# Patient Record
Sex: Male | Born: 1990 | Race: Black or African American | Hispanic: No | Marital: Married | Smoking: Former smoker
Health system: Southern US, Community
[De-identification: ages and names within clinical notes are randomized; demographics above are authoritative.]

## PROBLEM LIST (undated history)

## (undated) HISTORY — PX: EYE SURGERY: SHX253

---
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
## 2016-02-01 ENCOUNTER — Emergency Department (HOSPITAL_COMMUNITY)
Admission: EM | Admit: 2016-02-01 | Discharge: 2016-02-01 | Disposition: A | Discharge status: Home / Self Care | Payer: Self-pay | Attending: Emergency Medicine | Admitting: Emergency Medicine

## 2016-02-01 ENCOUNTER — Encounter (HOSPITAL_COMMUNITY): Payer: Self-pay | Admitting: *Deleted

## 2016-02-01 DIAGNOSIS — F17210 Nicotine dependence, cigarettes, uncomplicated: Secondary | ICD-10-CM | POA: Insufficient documentation

## 2016-02-01 DIAGNOSIS — R197 Diarrhea, unspecified: Secondary | ICD-10-CM | POA: Insufficient documentation

## 2016-02-01 MED ORDER — ONDANSETRON 8 MG PO TBDP
8.0000 mg | ORAL_TABLET | Freq: Once | ORAL | Status: AC
  Administered 2016-02-01: 8 mg via ORAL
  Filled 2016-02-01: qty 1

## 2016-02-01 NOTE — ED Notes (Signed)
Pt verbalized understanding of discharge instructions. Pt ambulatory to waiting room.  

## 2016-02-01 NOTE — ED Provider Notes (Signed)
  [facility]PT Provider Note   CSN: [id number] Arrival date & time: 02/01/16  0102     History   Chief Complaint Chief Complaint  Patient presents with  . Abdominal Pain    HPI Philip Bowman is a 26 y.o. male.  The history is provided by the patient.  Abdominal Pain   This is a new problem. The current episode started 3 to 5 hours ago. The problem occurs constantly. The problem has been gradually improving. The pain is associated with eating. The pain is located in the generalized abdominal region. The pain is mild. Associated symptoms include diarrhea and nausea. Pertinent negatives include fever, vomiting and dysuria. Nothing aggravates the symptoms. Nothing relieves the symptoms.  pt reports he ate at a local [employer] and soon after developed nausea/abd pain and diarrhea No other complaints He is requesting a work note   PMH - none Soc hx - no travel Past Surgical History:  Procedure Laterality Date  . EYE SURGERY Left        Home Medications    Prior to Admission medications   Not on File    Family History History reviewed. No pertinent family history.  Social History Social History  Substance Use Topics  . Smoking status: Current Some Day Smoker    Types: Cigarettes  . Smokeless tobacco: Never Used  . Alcohol use Yes     Comment: occasionally     Allergies   Patient has no allergy information on record.   Review of Systems Review of Systems  Constitutional: Negative for fever.  Cardiovascular: Negative for chest pain.  Gastrointestinal: Positive for abdominal pain, diarrhea and nausea. Negative for vomiting.  Genitourinary: Negative for dysuria.  All other systems reviewed and are negative.    Physical Exam Updated Vital Signs BP 123/71 (BP Location: Left Arm)   Pulse 65   Temp 97.4 F (36.3 C) (Oral)   Resp 20   Ht 5\' 7"  (1.702 m)   Wt 61.2 kg   SpO2 98%   BMI 21.14 kg/m   Physical Exam CONSTITUTIONAL: Well  developed/well nourished HEAD: Normocephalic/atraumatic EYES: EOMI/PERRL, no icterus ENMT: Mucous membranes moist NECK: supple no meningeal signs SPINE/BACK:entire spine nontender CV: S1/S2 noted, no murmurs/rubs/gallops noted LUNGS: Lungs are clear to auscultation bilaterally, no apparent distress ABDOMEN: soft, nontender NEURO: Pt is awake/alert/appropriate, moves all extremitiesx4.  No facial droop.   EXTREMITIES: pulses normal/equal, full ROM SKIN: warm, color normal PSYCH: no abnormalities of mood noted, alert and oriented to situation   ED Treatments / Results  Labs (all labs ordered are listed, but only abnormal results are displayed) Labs Reviewed - No data to display  EKG  EKG Interpretation None       Radiology No results found.  Procedures Procedures (including critical care time)  Medications Ordered in ED Medications  ondansetron (ZOFRAN-ODT) disintegrating tablet 8 mg (8 mg Oral Given 02/01/16 0141)     Initial Impression / Assessment and Plan / ED Course  I have reviewed the triage vital signs and the nursing notes.    Final Clinical Impressions(s) / ED Diagnoses   Final diagnoses:  Diarrhea of presumed infectious origin    New Prescriptions New Prescriptions   No medications on file     Zadie Rhineonald Giavanni Zeitlin, MD 02/01/16 [id number]

## 2016-02-01 NOTE — ED Triage Notes (Signed)
Pt c/o nausea and diarrhea after eating japanese food;

## 2016-02-21 DIAGNOSIS — R197 Diarrhea, unspecified: Secondary | ICD-10-CM | POA: Insufficient documentation

## 2016-02-21 DIAGNOSIS — R109 Unspecified abdominal pain: Secondary | ICD-10-CM | POA: Insufficient documentation

## 2016-02-21 DIAGNOSIS — F17210 Nicotine dependence, cigarettes, uncomplicated: Secondary | ICD-10-CM | POA: Insufficient documentation

## 2016-02-21 DIAGNOSIS — R1110 Vomiting, unspecified: Secondary | ICD-10-CM | POA: Insufficient documentation

## 2016-02-21 NOTE — ED Triage Notes (Signed)
Patient complaining of vomiting x 1 and diarrhea x 2 after [place] tonight. States "the chicken felt a little funny." States "if I don't go to work tonight I have to have a note so I came here."

## 2016-02-22 ENCOUNTER — Encounter (HOSPITAL_COMMUNITY): Payer: Self-pay | Admitting: Emergency Medicine

## 2016-02-22 ENCOUNTER — Emergency Department (HOSPITAL_COMMUNITY)
Admission: EM | Admit: 2016-02-22 | Discharge: 2016-02-22 | Disposition: A | Discharge status: Home / Self Care | Payer: Self-pay | Attending: Emergency Medicine | Admitting: Emergency Medicine

## 2016-02-22 DIAGNOSIS — R197 Diarrhea, unspecified: Secondary | ICD-10-CM

## 2016-02-22 DIAGNOSIS — R1110 Vomiting, unspecified: Secondary | ICD-10-CM

## 2016-02-22 NOTE — ED Provider Notes (Signed)
  [facility] Provider Note   CSN: [id number] Arrival date & time: 02/21/16  2340     History   Chief Complaint Chief Complaint  Patient presents with  . Emesis  . Diarrhea    HPI Livingston DionesStefan J Lesch is a 26 y.o. male.  The history is provided by the patient.  Emesis   This is a new problem. The current episode started 1 to 2 hours ago. The problem has been gradually improving. There has been no fever. Associated symptoms include abdominal pain and diarrhea. Pertinent negatives include no cough, no fever and no headaches. Risk factors include suspect food intake.  Diarrhea   Associated symptoms include abdominal pain and vomiting. Pertinent negatives include no headaches and no cough.  patient presents for vomiting/diarrhea and abdominal pain after [place] [employer] He had similar episode last month but it was a [employer] He is feeling improved He requests work note   PMH - none Past Surgical History:  Procedure Laterality Date  . EYE SURGERY Left        Home Medications    Prior to Admission medications   Not on File    Family History History reviewed. No pertinent family history.  Social History Social History  Substance Use Topics  . Smoking status: Current Some Day Smoker    Types: Cigarettes  . Smokeless tobacco: Never Used  . Alcohol use Yes     Comment: occasionally     Allergies   Patient has no known allergies.   Review of Systems Review of Systems  Constitutional: Negative for fever.  Respiratory: Negative for cough.   Gastrointestinal: Positive for abdominal pain, diarrhea and vomiting. Negative for blood in stool.  Neurological: Negative for headaches.  All other systems reviewed and are negative.    Physical Exam Updated Vital Signs BP 115/63 (BP Location: Left Arm)   Pulse 70   Temp 98.2 F (36.8 C) (Oral)   Resp 14   Ht 5\' 7"  (1.702 m)   Wt 61.2 kg   SpO2 100%   BMI 21.14 kg/m     Physical Exam CONSTITUTIONAL: Well developed/well nourished HEAD: Normocephalic/atraumatic EYES: EOMI/PERRL, no icterus ENMT: Mucous membranes moist NECK: supple no meningeal signs SPINE/BACK:entire spine nontender CV: S1/S2 noted, no murmurs/rubs/gallops noted LUNGS: Lungs are clear to auscultation bilaterally, no apparent distress ABDOMEN: soft, nontender NEURO: Pt is awake/alert/appropriate, moves all extremitiesx4.  No facial droop.   EXTREMITIES: pulses normal/equal, full ROM SKIN: warm, color normal PSYCH: no abnormalities of mood noted, alert and oriented to situation   ED Treatments / Results  Labs (all labs ordered are listed, but only abnormal results are displayed) Labs Reviewed - No data to display  EKG  EKG Interpretation None       Radiology No results found.  Procedures Procedures (including critical care time)  Medications Ordered in ED Medications - No data to display   Initial Impression / Assessment and Plan / ED Course  I have reviewed the triage vital signs and the nursing notes.        Final Clinical Impressions(s) / ED Diagnoses   Final diagnoses:  Vomiting and diarrhea    New Prescriptions There are no discharge medications for this patient.    Zadie Rhineonald Marlisha Vanwyk, MD 02/22/16 Emeline [id number]

## 2016-03-22 ENCOUNTER — Emergency Department (HOSPITAL_COMMUNITY)
Admission: EM | Admit: 2016-03-22 | Discharge: 2016-03-22 | Disposition: A | Discharge status: Home / Self Care | Payer: Self-pay | Attending: Emergency Medicine | Admitting: Emergency Medicine

## 2016-03-22 ENCOUNTER — Encounter (HOSPITAL_COMMUNITY): Payer: Self-pay | Admitting: *Deleted

## 2016-03-22 DIAGNOSIS — K591 Functional diarrhea: Secondary | ICD-10-CM | POA: Insufficient documentation

## 2016-03-22 DIAGNOSIS — F17210 Nicotine dependence, cigarettes, uncomplicated: Secondary | ICD-10-CM | POA: Insufficient documentation

## 2016-03-22 NOTE — ED Notes (Signed)
Pt states upon discharge "I'll be back in a month".

## 2016-03-22 NOTE — ED Provider Notes (Signed)
[facility] Provider Note   CSN: [id number] Arrival date & time: 03/22/16  0406  Time seen 04:25 AM   History   Chief Complaint Chief Complaint  Patient presents with  . Abdominal Pain    HPI Livingston DionesStefan J Kalan is a 26 y.o. male.  HPI  patient states he works third shift. He states when he got up this afternoon he started having abdominal discomfort about 7 PM. He states the discomfort was in his lower abdomen however he puts his hand above his umbilicus. At 7:30 PM he ate asandwich from [employer]. He went to work at [employer] PM and had to leave work at 2 AM. He states he's having "a little cramping" in his abdomen. He had 2 episodes of watery diarrhea. He denies fever, nausea, or vomiting. He denies feeling dizzy or lightheaded, he states he's having normal urinary output. He denies having a dry mouth. He states smoking makes the pain hurt more, however he ate a gravy biscuit at [employer] at 3 AM and his abdomen felt better. Patient states multiple times he needs a work note. Patient was seen for similar complaints on February 20. He states he does drink heavily 3 times a week. He states the last time was 2 nights ago area  PCP none  History reviewed. No pertinent past medical history.  There are no active problems to display for this patient.   Past Surgical History:  Procedure Laterality Date  . EYE SURGERY Left        Home Medications    Prior to Admission medications   Not on File    Family History History reviewed. No pertinent family history.  Social History Social History  Substance Use Topics  . Smoking status: Current Some Day Smoker    Types: Cigarettes  . Smokeless tobacco: Never Used  . Alcohol use Yes     Comment: occasionally  employed   Allergies   Patient has no known allergies.   Review of Systems Review of Systems  All other systems reviewed and are negative.    Physical Exam Updated Vital Signs BP 120/72 (BP Location: Right Arm)    Pulse 67   Temp 97.6 F (36.4 C) (Oral)   Resp 16   Ht 5\' 7"  (1.702 m)   Wt 135 lb (61.2 kg)   SpO2 100%   BMI 21.14 kg/m   Vital signs normal    Physical Exam  Constitutional: He is oriented to person, place, and time. He appears well-developed and well-nourished.  Non-toxic appearance. He does not appear ill. No distress.  Playing on his cell phone in NAD  HENT:  Head: Normocephalic and atraumatic.  Right Ear: External ear normal.  Left Ear: External ear normal.  Nose: Nose normal. No mucosal edema or rhinorrhea.  Mouth/Throat: Oropharynx is clear and moist and mucous membranes are normal. No dental abscesses or uvula swelling.  Eyes: Conjunctivae and EOM are normal. Pupils are equal, round, and reactive to light.  Neck: Normal range of motion and full passive range of motion without pain. Neck supple.  Cardiovascular: Normal rate, regular rhythm and normal heart sounds.  Exam reveals no gallop and no friction rub.   No murmur heard. Pulmonary/Chest: Effort normal and breath sounds normal. No respiratory distress. He has no wheezes. He has no rhonchi. He has no rales. He exhibits no tenderness and no crepitus.  Abdominal: Soft. Normal appearance and bowel sounds are normal. He exhibits no distension. There is no tenderness. There is  no rebound and no guarding.  Musculoskeletal: Normal range of motion. He exhibits no edema or tenderness.  Moves all extremities well.   Neurological: He is alert and oriented to person, place, and time. He has normal strength. No cranial nerve deficit.  Skin: Skin is warm, dry and intact. No rash noted. No erythema. No pallor.  Psychiatric: He has a normal mood and affect. His speech is normal and behavior is normal. His mood appears not anxious.  Nursing note and vitals reviewed.    ED Treatments / Results   Procedures Procedures (including critical care time)  Medications Ordered in ED Medications - No data to display   Initial  Impression / Assessment and Plan / ED Course  I have reviewed the triage vital signs and the nursing notes.  Pertinent labs & imaging results that were available during my care of the patient were reviewed by me and considered in my medical decision making (see chart for details).  Patient does not want any testing to be done, he does not want any IV fluids. Basically patient just wants a work note. He was advised he could take Imodium over-the-counter for diarrhea. He should drink plenty of fluids however he does not appear to be dehydrated at this point.  Final Clinical Impressions(s) / ED Diagnoses   Final diagnoses:  Functional diarrhea     Plan discharge  Devoria Albe, MD, Concha Pyo, MD 03/22/16 [phone]

## 2016-03-22 NOTE — ED Notes (Signed)
Pt ambulatory to waiting room. Pt verbalized understanding of discharge instructions.   

## 2016-03-22 NOTE — Discharge Instructions (Signed)
You can take imodium OTC for diarrhea. Drink plenty of fluids. Stop drinking heavily.

## 2016-03-22 NOTE — ED Triage Notes (Signed)
Pt c/o generalized abdominal pain that started yesterday with some diarrhea

## 2016-04-18 ENCOUNTER — Encounter (HOSPITAL_COMMUNITY): Payer: Self-pay | Admitting: Emergency Medicine

## 2016-04-18 ENCOUNTER — Emergency Department (HOSPITAL_COMMUNITY): Admission: EM | Admit: 2016-04-18 | Discharge: 2016-04-18 | Discharge status: Absent without leave | Payer: Self-pay

## 2016-04-18 ENCOUNTER — Emergency Department (HOSPITAL_COMMUNITY)
Admission: EM | Admit: 2016-04-18 | Discharge: 2016-04-18 | Disposition: A | Discharge status: Home / Self Care | Payer: Self-pay | Attending: Emergency Medicine | Admitting: Emergency Medicine

## 2016-04-18 DIAGNOSIS — R1084 Generalized abdominal pain: Secondary | ICD-10-CM | POA: Insufficient documentation

## 2016-04-18 DIAGNOSIS — Z0289 Encounter for other administrative examinations: Secondary | ICD-10-CM | POA: Insufficient documentation

## 2016-04-18 DIAGNOSIS — F17210 Nicotine dependence, cigarettes, uncomplicated: Secondary | ICD-10-CM | POA: Insufficient documentation

## 2016-04-18 NOTE — ED Triage Notes (Signed)
Per patient he thinks he has a stomach virus?  Has not been feeling well all day, has had 3 episodes of diarrhea and miss work today.

## 2016-04-18 NOTE — Discharge Instructions (Signed)
Please use Emergency room for emergencies. We have noticed that you are coming to the ER for the work note every month around the same time, and that is not appropriate use of the ER. Go to an urgent care next time.

## 2016-04-18 NOTE — ED Notes (Signed)
Patient was upset and disgruntle because EDP Nanavati would not give his a work note to excuse him from work, patient stated to [employer], "Go ahead dude go do some work", patient signed and was given discharge papers, walked out ED no visible upset.

## 2016-04-18 NOTE — ED Provider Notes (Addendum)
[facility] Provider Note   CSN: [id number] Arrival date & time: 04/18/16  0308     History   Chief Complaint Chief Complaint  Patient presents with  . Abdominal Pain  . Diarrhea    HPI Philip Bowman is a 26 y.o. male.  HPI Pt comes in with cc of abd pain. Pt reports that this afternoon, after eating chicken noodle soup he started having abd pain and had 3 loose BM. Pt went to [facility], there was an 8 hour wait, so he came here - mainly so that he can get a work note. He has no abd pain now, and denies any diarrhea, emesis.  Interestingly, pt has been seen around this time of the month every month since January with similar vague GI complains - and always wants a work note. I informed him that ER is not a place you just come for work notes - as the only thing he wants is a work note right now.  History reviewed. No pertinent past medical history.  There are no active problems to display for this patient.   Past Surgical History:  Procedure Laterality Date  . EYE SURGERY Left        Home Medications    Prior to Admission medications   Not on File    Family History History reviewed. No pertinent family history.  Social History Social History  Substance Use Topics  . Smoking status: Current Some Day Smoker    Types: Cigarettes  . Smokeless tobacco: Never Used  . Alcohol use Yes     Comment: occasionally     Allergies   Patient has no known allergies.   Review of Systems Review of Systems  Constitutional: Negative for activity change.  Gastrointestinal: Positive for abdominal pain.  Allergic/Immunologic: Negative for immunocompromised state.  Neurological: Negative for weakness.     Physical Exam Updated Vital Signs BP 99/64 (BP Location: Left Arm)   Pulse (!) 53   Temp 97.6 F (36.4 C) (Oral)   Resp 16   Ht  (1.702 m)   Wt 135 lb (61.2 kg)   SpO2 97%   BMI 21.14 kg/m   Physical Exam  Constitutional: He is oriented to person,  place, and time. He appears well-developed.  HENT:  Head: Atraumatic.  Neck: Neck supple.  Cardiovascular: Normal rate.   Pulmonary/Chest: Effort normal.  Neurological: He is alert and oriented to person, place, and time.  Skin: Skin is warm.  Nursing note and vitals reviewed.     ED Treatments / Results  Labs (all labs ordered are listed, but only abnormal results are displayed) Labs Reviewed - No data to display  EKG  EKG Interpretation None       Radiology No results found.  Procedures Procedures (including critical care time)  Medications Ordered in ED Medications - No data to display   Initial Impression / Assessment and Plan / ED Course  I have reviewed the triage vital signs and the nursing notes.  Pertinent labs & imaging results that were available during my care of the patient were reviewed by me and considered in my medical decision making (see chart for details).  Clinical Course as of Apr 19 535  Tue Apr 18, 2016  4696 ER nurse just informed me that he was seen last month, and before discharge he commented that he will see [place] next month.  [AN]    Clinical Course User Index [AN] Derwood Kaplan, MD  Pt comes in for a work note. He will be advised to return to the work today. He has no emergent pathology, and the visit appears to be suspicious for secondary gain of getting work excuse. This is evident by the fact that pt informed me that he went to [facility] ER first, and since there was an 8 hour wait, he decided to come here. In the interim he has been stable. I had printed a note - but have decided that without any true emergent diagnosis, he really doesn't need an ER work note. I have asked him to use the discharge paperwork as proof that he was here.  5:37 AM Clinical Course as of Apr 19 535  Tue Apr 18, 2016  0536 ER nurse just informed me that he was seen last month, and before discharge he commented that he will see [place] next month.  [AN]      Clinical Course User Index [AN] Derwood Kaplan, MD    Final Clinical Impressions(s) / ED Diagnoses   Final diagnoses:  Generalized abdominal pain  Encounter to obtain excuse from work    New Prescriptions New Prescriptions   No medications on file     Derwood Kaplan, MD 04/18/16 0530    Derwood Kaplan, MD 04/18/16 6045    Derwood Kaplan, MD 04/18/16 [phone]

## 2016-06-23 ENCOUNTER — Encounter (HOSPITAL_COMMUNITY): Payer: Self-pay | Admitting: Nurse Practitioner

## 2016-06-23 DIAGNOSIS — F17210 Nicotine dependence, cigarettes, uncomplicated: Secondary | ICD-10-CM | POA: Insufficient documentation

## 2016-06-23 DIAGNOSIS — R1031 Right lower quadrant pain: Secondary | ICD-10-CM | POA: Insufficient documentation

## 2016-06-23 DIAGNOSIS — R1010 Upper abdominal pain, unspecified: Secondary | ICD-10-CM | POA: Insufficient documentation

## 2016-06-23 NOTE — ED Triage Notes (Signed)
Pt c/o abdominal pain 7/10 and diarrhea, onset a day ago. Denies N/V

## 2016-06-24 ENCOUNTER — Emergency Department (HOSPITAL_COMMUNITY)
Admission: EM | Admit: 2016-06-24 | Discharge: 2016-06-24 | Disposition: A | Discharge status: Home / Self Care | Payer: Self-pay | Attending: Emergency Medicine | Admitting: Emergency Medicine

## 2016-06-24 DIAGNOSIS — R1010 Upper abdominal pain, unspecified: Secondary | ICD-10-CM

## 2016-06-24 LAB — COMPREHENSIVE METABOLIC PANEL
ALT: 19 U/L (ref 17–63)
AST: 24 U/L (ref 15–41)
Albumin: 3.9 g/dL (ref 3.5–5.0)
Alkaline Phosphatase: 92 U/L (ref 38–126)
Anion gap: 6 (ref 5–15)
BUN: 16 mg/dL (ref 6–20)
CHLORIDE: 109 mmol/L (ref 101–111)
CO2: 27 mmol/L (ref 22–32)
CREATININE: 0.99 mg/dL (ref 0.61–1.24)
Calcium: 9.3 mg/dL (ref 8.9–10.3)
GFR calc Af Amer: >60 mL/min (ref >60)
GFR calc non Af Amer: >60 mL/min (ref >60)
GLUCOSE: 96 mg/dL (ref 65–99)
Potassium: 3.6 mmol/L (ref 3.5–5.1)
SODIUM: 142 mmol/L (ref 135–145)
Total Bilirubin: 0.6 mg/dL (ref 0.3–1.2)
Total Protein: 6.3 g/dL — ABNORMAL LOW (ref 6.5–8.1)

## 2016-06-24 LAB — CBC
HCT: 38.8 % — ABNORMAL LOW (ref 39.0–52.0)
Hemoglobin: 13.3 g/dL (ref 13.0–17.0)
MCH: 32.5 pg (ref 26.0–34.0)
MCHC: 34.3 g/dL (ref 30.0–36.0)
MCV: 94.9 fL (ref 78.0–100.0)
PLATELETS: 219 10*3/uL (ref 150–400)
RBC: 4.09 MIL/uL — ABNORMAL LOW (ref 4.22–5.81)
RDW: 12.4 % (ref 11.5–15.5)
WBC: 8.5 10*3/uL (ref 4.0–10.5)

## 2016-06-24 LAB — LIPASE, BLOOD: LIPASE: 29 U/L (ref 11–51)

## 2016-06-24 NOTE — Discharge Instructions (Signed)
All the results in the ER are normal, labs and imaging. We are not sure what is causing your symptoms. The workup in the ER is not complete, and is limited to screening for life threatening and emergent conditions only, so please see a primary care doctor for further evaluation.  

## 2016-06-24 NOTE — ED Provider Notes (Addendum)
[facility]-EMERGENCY DEPT Provider Note   CSN: [id number] Arrival date & time: 06/23/16  2214  By signing my name below, I, Diona BrownerJennifer Gorman, attest that this documentation has been prepared under the direction and in the presence of Derwood KaplanNanavati, Abenezer Odonell, MD. Electronically Signed: Diona BrownerJennifer Gorman, ED Scribe. 06/24/16. 1:24 AM.  History   Chief Complaint Chief Complaint  Patient presents with  . Abdominal Pain  . Diarrhea    HPI Philip Bowman is a 26 y.o. male who presents to the Emergency Department complaining of intermittent, 7/10, abdominal pain that started ~ 2 day ago. Pain is exacerbated after eating. Associated sx include diarrhea (3). Has had pain like this before but he is unsure why it happened. Pt hasn't had it in a while. He notes having this pain a couple of times a month. He currently doesn't have a PCP. Pt denies fever, nausea and vomiting.  The history is provided by the patient. No language interpreter was used.    History reviewed. No pertinent past medical history.  There are no active problems to display for this patient.   Past Surgical History:  Procedure Laterality Date  . EYE SURGERY Left        Home Medications    Prior to Admission medications   Not on File    Family History History reviewed. No pertinent family history.  Social History Social History  Substance Use Topics  . Smoking status: Current Some Day Smoker    Types: Cigarettes  . Smokeless tobacco: Never Used  . Alcohol use Yes     Comment: occasionally     Allergies   Patient has no known allergies.   Review of Systems Review of Systems  Constitutional: Negative for fever.  Gastrointestinal: Positive for abdominal pain and diarrhea. Negative for nausea and vomiting.     Physical Exam Updated Vital Signs BP 113/74 (BP Location: Right Arm)   Pulse (!) 58   Temp 98.8 F (37.1 C) (Oral)   Resp 16   Ht 5\' 7"  (1.702 m)   Wt 61.2 kg (135 lb)   SpO2 98%   BMI 21.14 kg/m    Physical Exam  Constitutional: He is oriented to person, place, and time. He appears well-developed and well-nourished.  HENT:  Head: Normocephalic.  Mouth/Throat: Oropharynx is clear and moist.  Eyes: EOM are normal. No scleral icterus.  Neck: Normal range of motion.  Cardiovascular: Normal rate, regular rhythm and normal heart sounds.   Pulmonary/Chest: Effort normal.  Lungs are clear to ausculation.   Abdominal: Soft. He exhibits no distension and no mass. There is tenderness in the right lower quadrant. There is no rebound and no guarding.  Musculoskeletal: Normal range of motion.  Neurological: He is alert and oriented to person, place, and time.  Psychiatric: He has a normal mood and affect.  Nursing note and vitals reviewed.    ED Treatments / Results  DIAGNOSTIC STUDIES: Oxygen Saturation is 98% on RA, normal by my interpretation.   COORDINATION OF CARE: 1:24 AM-Discussed next steps with pt. Pt verbalized understanding and is agreeable with the plan.   Labs (all labs ordered are listed, but only abnormal results are displayed) Labs Reviewed  COMPREHENSIVE METABOLIC PANEL - Abnormal; Notable for the following:       Result Value   Total Protein 6.3 (*)    All other components within normal limits  CBC - Abnormal; Notable for the following:    RBC 4.09 (*)    HCT 38.8 (*)  All other components within normal limits  LIPASE, BLOOD    EKG  EKG Interpretation None       Radiology No results found.  Procedures Procedures (including critical care time)  Medications Ordered in ED Medications - No data to display   Initial Impression / Assessment and Plan / ED Course  I have reviewed the triage vital signs and the nursing notes.  Pertinent labs & imaging results that were available during my care of the patient were reviewed by me and considered in my medical decision making (see chart for details).     Pt comes in with cc of abd pain. Pt has  recurrent abd pain o he has no peritoneal signs and doesn't appear dehydrated. Labs are reassuring.    Final Clinical Impressions(s) / ED Diagnoses   Final diagnoses:  Recurrent upper abdominal pain    New Prescriptions New Prescriptions   No medications on file   I personally performed the services described in this documentation, which was scribed in my presence. The recorded information has been reviewed and is accurate.  Pt advised again to see primary doctors for recurrent abd pain.   Derwood Kaplan, MD 06/24/16 0145    Derwood Kaplan, MD 06/24/16 [phone]

## 2016-07-18 ENCOUNTER — Encounter (HOSPITAL_COMMUNITY): Payer: Self-pay | Admitting: Emergency Medicine

## 2016-07-18 ENCOUNTER — Emergency Department (HOSPITAL_COMMUNITY)
Admission: EM | Admit: 2016-07-18 | Discharge: 2016-07-18 | Disposition: A | Discharge status: Home / Self Care | Payer: Self-pay | Attending: Emergency Medicine | Admitting: Emergency Medicine

## 2016-07-18 DIAGNOSIS — R300 Dysuria: Secondary | ICD-10-CM | POA: Insufficient documentation

## 2016-07-18 DIAGNOSIS — F17210 Nicotine dependence, cigarettes, uncomplicated: Secondary | ICD-10-CM | POA: Insufficient documentation

## 2016-07-18 DIAGNOSIS — Z202 Contact with and (suspected) exposure to infections with a predominantly sexual mode of transmission: Secondary | ICD-10-CM | POA: Insufficient documentation

## 2016-07-18 DIAGNOSIS — R369 Urethral discharge, unspecified: Secondary | ICD-10-CM | POA: Insufficient documentation

## 2016-07-18 LAB — URINALYSIS, ROUTINE W REFLEX MICROSCOPIC
BILIRUBIN URINE: NEGATIVE
GLUCOSE, UA: NEGATIVE mg/dL
Hgb urine dipstick: NEGATIVE
KETONES UR: NEGATIVE mg/dL
NITRITE: NEGATIVE
PH: 6 (ref 5.0–8.0)
Protein, ur: NEGATIVE mg/dL
SPECIFIC GRAVITY, URINE: 1.021 (ref 1.005–1.030)
Squamous Epithelial / LPF: NONE SEEN

## 2016-07-18 MED ORDER — METRONIDAZOLE 500 MG PO TABS
2000.0000 mg | ORAL_TABLET | Freq: Once | ORAL | Status: AC
  Administered 2016-07-18: 2000 mg via ORAL
  Filled 2016-07-18: qty 4

## 2016-07-18 MED ORDER — AZITHROMYCIN 250 MG PO TABS
1000.0000 mg | ORAL_TABLET | Freq: Once | ORAL | Status: AC
  Administered 2016-07-18: 1000 mg via ORAL
  Filled 2016-07-18: qty 4

## 2016-07-18 MED ORDER — CEFTRIAXONE SODIUM 250 MG IJ SOLR
250.0000 mg | Freq: Once | INTRAMUSCULAR | Status: AC
  Administered 2016-07-18: 250 mg via INTRAMUSCULAR
  Filled 2016-07-18: qty 250

## 2016-07-18 NOTE — Discharge Instructions (Signed)
You have been tested for STDs. Some of these results are still pending. Any abnormalities will be called to you. You have been prophylactically treated for gonorrhea, Chlamydia, and Trichomonas. This does not mean you necessarily have these diseases, treatment is precautionary. Be sure to follow safe sex practices, including monogamy and/or condom use. No sexual contact for at least 2 weeks.

## 2016-07-18 NOTE — ED Provider Notes (Signed)
[facility]-EMERGENCY DEPT Provider Note   CSN: [id number] Arrival date & time: 07/18/16  1516   By signing my name below, I, Philip Bowman, attest that this documentation has been prepared under the direction and in the presence of Philip Joy, PA-C. Electronically signed, Philip Bowman, ED Scribe. 07/18/16. 6:29 PM.  History   Chief Complaint Chief Complaint  Patient presents with  . Exposure to STD  . Dysuria   The history is provided by the patient and medical records. No language interpreter was used.    Philip Bowman is a 26 y.o. male presenting to the Emergency Department concerning Dysuria beginning yesterday. Also endorses thick, white penile discharge. States she was recently told by one of his sexual partners that she tested positive for chlamydia. No fever, pain with BM's, abdominal pain, N/V, penile swelling or tenderness, scrotal swelling or pain, or any other complaints.     History reviewed. No pertinent past medical history.  There are no active problems to display for this patient.   Past Surgical History:  Procedure Laterality Date  . EYE SURGERY Left        Home Medications    Prior to Admission medications   Not on File    Family History No family history on file.  Social History Social History  Substance Use Topics  . Smoking status: Current Some Day Smoker    Types: Cigarettes  . Smokeless tobacco: Never Used  . Alcohol use Yes     Comment: occasionally     Allergies   Patient has no known allergies.   Review of Systems Review of Systems  Constitutional: Negative for fever.  Gastrointestinal: Negative for abdominal pain, nausea and vomiting.  Genitourinary: Positive for discharge and dysuria. Negative for difficulty urinating, genital sores, hematuria, penile swelling, scrotal swelling and testicular pain.     Physical Exam Updated Vital Signs BP 109/62 (BP Location: Right Arm)   Pulse 72   Temp 98.1 F (36.7 C) (Oral)   Resp  16   Ht 5\' 6"  (1.676 m)   Wt 135 lb (61.2 kg)   SpO2 100%   BMI 21.79 kg/m   Physical Exam  Constitutional: He appears well-developed and well-nourished. No distress.  HENT:  Head: Normocephalic and atraumatic.  Eyes: Conjunctivae are normal.  Neck: Neck supple.  Cardiovascular: Normal rate and regular rhythm.   Pulmonary/Chest: Effort normal.  Abdominal: Soft. He exhibits no distension. There is no tenderness. There is no guarding.  Genitourinary: Testes normal. Cremasteric reflex is present.  Genitourinary Comments: Scant, thick, white discharge from the urethra. Penis, scrotum, and testicles without swelling, lesions, or tenderness. Cremasteric reflex intact. Otherwise normal male genitalia. Scribe, Philip Bowman, served as Biomedical engineer during the exam.  Lymphadenopathy: No inguinal adenopathy noted on the right or left side.  Neurological: He is alert.  Skin: Skin is warm and dry. He is not diaphoretic.  Psychiatric: He has a normal mood and affect. His behavior is normal.  Nursing note and vitals reviewed.    ED Treatments / Results  DIAGNOSTIC STUDIES: Oxygen Saturation is 100% on RA, NL by my interpretation.    COORDINATION OF CARE: 6:27 PM-Discussed next steps with pt. Pt verbalized understanding and is agreeable with the plan. Pt treated for STD in ED and prepared for DC.   Labs (all labs ordered are listed, but only abnormal results are displayed) Labs Reviewed  URINALYSIS, ROUTINE W REFLEX MICROSCOPIC - Abnormal; Notable for the following:       Result Value  Leukocytes, UA SMALL (*)    Bacteria, UA RARE (*)    All other components within normal limits  RPR  HIV ANTIBODY (ROUTINE TESTING)  GC/CHLAMYDIA PROBE AMP (Philip Bowman) NOT AT [facility]    EKG  EKG Interpretation None       Radiology No results found.  Procedures Procedures (including critical care time)  Medications Ordered in ED Medications  cefTRIAXone (ROCEPHIN) injection 250 mg (250 mg  Intramuscular Given 07/18/16 1752)  azithromycin (ZITHROMAX) tablet 1,000 mg (1,000 mg Oral Given 07/18/16 1754)  metroNIDAZOLE (FLAGYL) tablet 2,000 mg (2,000 mg Oral Given 07/18/16 1753)     Initial Impression / Assessment and Plan / ED Course  I have reviewed the triage vital signs and the nursing notes.  Pertinent labs & imaging results that were available during my care of the patient were reviewed by me and considered in my medical decision making (see chart for details).     Patient presents with concern for STD exposure and symptoms. Patient tested and treated. Counseled on safe sex practices.  Final Clinical Impressions(s) / ED Diagnoses   Final diagnoses:  STD exposure  Penile discharge  Dysuria    New Prescriptions There are no discharge medications for this patient. I personally performed the services described in this documentation, which was scribed in my presence. The recorded information has been reviewed and is accurate.   Anselm PancoastJoy, Philip C, PA-C 07/19/16 1516    Shaune PollackIsaacs, Cameron, MD 07/20/16 [phone]

## 2016-07-18 NOTE — ED Notes (Signed)
Pt denies pain but does states that it burn is when he urinates. Pt states his recent partner contacted him and report that she had Chlamydia, and pt states that last night was the first time he had symptoms.Pt is interested in HIV/STD testing

## 2016-07-18 NOTE — ED Triage Notes (Signed)
Patient reports that he had sex with a male who contacted him and told him she was positive for chlamydia and that he needed to be treated. Patient states that yesterday he started having pain with urination and some penile discharge.

## 2016-07-19 LAB — HIV ANTIBODY (ROUTINE TESTING W REFLEX): HIV Screen 4th Generation wRfx: NONREACTIVE

## 2016-07-19 LAB — GC/CHLAMYDIA PROBE AMP (~~LOC~~) NOT AT ARMC
CHLAMYDIA, DNA PROBE: NEGATIVE
NEISSERIA GONORRHEA: POSITIVE — AB

## 2016-07-19 LAB — RPR: RPR Ser Ql: NONREACTIVE

## 2016-09-24 ENCOUNTER — Encounter (HOSPITAL_COMMUNITY): Payer: Self-pay | Admitting: *Deleted

## 2016-09-24 DIAGNOSIS — K529 Noninfective gastroenteritis and colitis, unspecified: Secondary | ICD-10-CM | POA: Insufficient documentation

## 2016-09-24 DIAGNOSIS — F17210 Nicotine dependence, cigarettes, uncomplicated: Secondary | ICD-10-CM | POA: Insufficient documentation

## 2016-09-24 LAB — CBC
HEMATOCRIT: 41.5 % (ref 39.0–52.0)
HEMOGLOBIN: 14.5 g/dL (ref 13.0–17.0)
MCH: 33 pg (ref 26.0–34.0)
MCHC: 34.9 g/dL (ref 30.0–36.0)
MCV: 94.3 fL (ref 78.0–100.0)
Platelets: 250 10*3/uL (ref 150–400)
RBC: 4.4 MIL/uL (ref 4.22–5.81)
RDW: 12.4 % (ref 11.5–15.5)
WBC: 8.1 10*3/uL (ref 4.0–10.5)

## 2016-09-24 NOTE — ED Triage Notes (Signed)
Pt states he thinks he may have a virus, other people at work have had similar symptoms. N/V/D for a couple of days, diarrhea after eating or drinking today

## 2016-09-25 ENCOUNTER — Emergency Department (HOSPITAL_COMMUNITY)
Admission: EM | Admit: 2016-09-25 | Discharge: 2016-09-25 | Disposition: A | Discharge status: Home / Self Care | Payer: Self-pay | Attending: Emergency Medicine | Admitting: Emergency Medicine

## 2016-09-25 DIAGNOSIS — K529 Noninfective gastroenteritis and colitis, unspecified: Secondary | ICD-10-CM

## 2016-09-25 LAB — COMPREHENSIVE METABOLIC PANEL
ALT: 25 U/L (ref 17–63)
AST: 27 U/L (ref 15–41)
Albumin: 3.9 g/dL (ref 3.5–5.0)
Alkaline Phosphatase: 88 U/L (ref 38–126)
Anion gap: 6 (ref 5–15)
BILIRUBIN TOTAL: 0.8 mg/dL (ref 0.3–1.2)
BUN: 15 mg/dL (ref 6–20)
CHLORIDE: 108 mmol/L (ref 101–111)
CO2: 25 mmol/L (ref 22–32)
Calcium: 9.6 mg/dL (ref 8.9–10.3)
Creatinine, Ser: 0.87 mg/dL (ref 0.61–1.24)
Glucose, Bld: 106 mg/dL — ABNORMAL HIGH (ref 65–99)
POTASSIUM: 4 mmol/L (ref 3.5–5.1)
Sodium: 139 mmol/L (ref 135–145)
TOTAL PROTEIN: 6.6 g/dL (ref 6.5–8.1)

## 2016-09-25 LAB — LIPASE, BLOOD: LIPASE: 28 U/L (ref 11–51)

## 2016-09-25 MED ORDER — ONDANSETRON 8 MG PO TBDP: ORAL_TABLET | ORAL | 0 refills | Status: DC

## 2016-09-25 NOTE — ED Provider Notes (Signed)
[facility]-EMERGENCY DEPT Provider Note   CSN: [id number] Arrival date & time: 09/24/16  2100     History   Chief Complaint Chief Complaint  Patient presents with  . Abdominal Pain    HPI Philip Bowman is a 26 y.o. male.  Patient is a 26 year old male with no significant past medical history. He presents with a 2 day history of nausea, vomiting, diarrhea. All has been non-bloody. He denies any fevers or chills. He does admit to abdominal cramping, but denies any focal abdominal tenderness. He reports several coworkers ill in a similar fashion.   The history is provided by the patient.  Abdominal Pain   This is a new problem. The current episode started 2 days ago. The problem occurs constantly. The problem has been gradually worsening. The pain is associated with eating. The pain is located in the generalized abdominal region. The quality of the pain is cramping. The pain is moderate. Associated symptoms include diarrhea, nausea and vomiting. Pertinent negatives include fever, hematochezia, melena, constipation and dysuria. The symptoms are aggravated by eating. Nothing relieves the symptoms.    History reviewed. No pertinent past medical history.  There are no active problems to display for this patient.   Past Surgical History:  Procedure Laterality Date  . EYE SURGERY Left        Home Medications    Prior to Admission medications   Not on File    Family History No family history on file.  Social History Social History  Substance Use Topics  . Smoking status: Current Some Day Smoker    Types: Cigarettes  . Smokeless tobacco: Never Used  . Alcohol use Yes     Comment: occasionally     Allergies   Patient has no known allergies.   Review of Systems Review of Systems  Constitutional: Negative for fever.  Gastrointestinal: Positive for abdominal pain, diarrhea, nausea and vomiting. Negative for constipation, hematochezia and melena.  Genitourinary:  Negative for dysuria.  All other systems reviewed and are negative.    Physical Exam Updated Vital Signs BP 130/83 (BP Location: Left Arm)   Pulse (!) 108   Temp 98.6 F (37 C) (Oral)   Resp 18   Ht  (1.702 m)   Wt 59.9 kg (132 lb)   SpO2 99%   BMI 20.67 kg/m   Physical Exam  Constitutional: He is oriented to person, place, and time. He appears well-developed and well-nourished. No distress.  HENT:  Head: Normocephalic and atraumatic.  Mouth/Throat: Oropharynx is clear and moist.  Neck: Normal range of motion. Neck supple.  Cardiovascular: Normal rate and regular rhythm.  Exam reveals no friction rub.   No murmur heard. Pulmonary/Chest: Effort normal and breath sounds normal. No respiratory distress. He has no wheezes. He has no rales.  Abdominal: Soft. Bowel sounds are normal. He exhibits no distension. There is no tenderness.  Musculoskeletal: Normal range of motion. He exhibits no edema.  Neurological: He is alert and oriented to person, place, and time. Coordination normal.  Skin: Skin is warm and dry. He is not diaphoretic.  Nursing note and vitals reviewed.    ED Treatments / Results  Labs (all labs ordered are listed, but only abnormal results are displayed) Labs Reviewed  COMPREHENSIVE METABOLIC PANEL - Abnormal; Notable for the following:       Result Value   Glucose, Bld 106 (*)    All other components within normal limits  LIPASE, BLOOD  CBC  URINALYSIS, ROUTINE  W REFLEX MICROSCOPIC    EKG  EKG Interpretation None       Radiology No results found.  Procedures Procedures (including critical care time)  Medications Ordered in ED Medications - No data to display   Initial Impression / Assessment and Plan / ED Course  I have reviewed the triage vital signs and the nursing notes.  Pertinent labs & imaging results that were available during my care of the patient were reviewed by me and considered in my medical decision making (see chart  for details).  Patient's laboratory studies are reassuring. His physical examination is unremarkable and abdomen is benign. He appears well-hydrated. I suspect he has some sort of viral gastroenteritis. He will be treated with Zofran, clears as tolerated, and follow-up as needed. He has requested a work excuse which she will be given.  Final Clinical Impressions(s) / ED Diagnoses   Final diagnoses:  None    New Prescriptions New Prescriptions   No medications on file     Geoffery Lyons, MD 09/25/16 [phone]

## 2016-09-25 NOTE — Discharge Instructions (Signed)
Zofran as prescribed as needed for nausea. ° °Clear liquid diet for the next 12 hours, then slowly advance to normal. ° °Return to the emergency department if you develop severe abdominal pain, bloody stools, high fevers, or other new and concerning symptoms. °

## 2016-10-17 ENCOUNTER — Encounter (HOSPITAL_COMMUNITY): Payer: Self-pay | Admitting: *Deleted

## 2016-10-17 ENCOUNTER — Emergency Department (HOSPITAL_COMMUNITY)
Admission: EM | Admit: 2016-10-17 | Discharge: 2016-10-17 | Disposition: A | Discharge status: Home / Self Care | Payer: Self-pay | Attending: Emergency Medicine | Admitting: Emergency Medicine

## 2016-10-17 DIAGNOSIS — F17210 Nicotine dependence, cigarettes, uncomplicated: Secondary | ICD-10-CM | POA: Insufficient documentation

## 2016-10-17 DIAGNOSIS — B9789 Other viral agents as the cause of diseases classified elsewhere: Secondary | ICD-10-CM

## 2016-10-17 DIAGNOSIS — J069 Acute upper respiratory infection, unspecified: Secondary | ICD-10-CM | POA: Insufficient documentation

## 2016-10-17 LAB — RAPID STREP SCREEN (MED CTR MEBANE ONLY): STREPTOCOCCUS, GROUP A SCREEN (DIRECT): NEGATIVE

## 2016-10-17 NOTE — ED Provider Notes (Signed)
[facility]-EMERGENCY DEPT Provider Note   CSN: [id number] Arrival date & time: 10/17/16  1328     History   Chief Complaint Chief Complaint  Patient presents with  . Sore Throat  . Cough    HPI Philip Bowman is a 26 y.o. male with no significant past medical history presenting with 2 days of progressive onset congestion, sore throat, cough and chills. Known ill contacts at work. He denies fever, nausea, vomiting, myalgias or other symptoms.  HPI  History reviewed. No pertinent past medical history.  There are no active problems to display for this patient.   Past Surgical History:  Procedure Laterality Date  . EYE SURGERY Left        Home Medications    Prior to Admission medications   Medication Sig Start Date End Date Taking? Authorizing Provider  ondansetron (ZOFRAN ODT) 8 MG disintegrating tablet  ODT q4 hours prn nausea 09/25/16   Geoffery Lyons, MD    Family History No family history on file.  Social History Social History  Substance Use Topics  . Smoking status: Current Some Day Smoker    Types: Cigarettes  . Smokeless tobacco: Never Used  . Alcohol use Yes     Comment: occasionally     Allergies   Patient has no known allergies.   Review of Systems Review of Systems  Constitutional: Positive for chills. Negative for activity change, appetite change and fever.  HENT: Positive for congestion and sore throat. Negative for ear pain, trouble swallowing and voice change.   Respiratory: Positive for cough. Negative for shortness of breath, wheezing and stridor.   Cardiovascular: Negative for chest pain and palpitations.  Gastrointestinal: Negative for abdominal pain, nausea and vomiting.  Musculoskeletal: Negative for myalgias, neck pain and neck stiffness.  Skin: Negative for color change, pallor and rash.  Neurological: Negative for dizziness, seizures, syncope, weakness and headaches.     Physical Exam Updated  Vital Signs BP 115/65 (BP Location: Left Arm)   Pulse 76   Temp 99.3 F (37.4 C) (Oral)   Resp 18   Ht  (1.676 m)   Wt 63.5 kg (140 lb)   SpO2 94%   BMI 22.60 kg/m   Physical Exam  Constitutional: He appears well-developed and well-nourished. No distress.  Afebrile, nontoxic-appearing, sitting comfortably in chair in no acute distress.  HENT:  Head: Normocephalic and atraumatic.  Mouth/Throat: Oropharynx is clear and moist. No oropharyngeal exudate.  Eyes: Conjunctivae and EOM are normal.  Neck: Normal range of motion. Neck supple.  Cardiovascular: Normal rate, regular rhythm and normal heart sounds.   No murmur heard. Pulmonary/Chest: Effort normal and breath sounds normal. No respiratory distress. He has no wheezes. He has no rales.  Musculoskeletal: Normal range of motion. He exhibits no edema.  Lymphadenopathy:    He has no cervical adenopathy.  Neurological: He is alert.  Skin: Skin is warm. No rash noted. He is not diaphoretic. No erythema. No pallor.  Psychiatric: He has a normal mood and affect.  Nursing note and vitals reviewed.    ED Treatments / Results  Labs (all labs ordered are listed, but only abnormal results are displayed) Labs Reviewed  RAPID STREP SCREEN (NOT AT [facility])  CULTURE, GROUP A STREP [facility])    EKG  EKG Interpretation None       Radiology No results found.  Procedures Procedures (including critical care time)  Medications Ordered in ED Medications - No data to display  Initial Impression / Assessment and Plan / ED Course  I have reviewed the triage vital signs and the nursing notes.  Pertinent labs & imaging results that were available during my care of the patient were reviewed by me and considered in my medical decision making (see chart for details).    Patient presents with 2 days of upper respiratory infection symptoms. Non-ill contacts at work. nontoxic-appearing afebrile. Lungs CTA bilaterally, oropharynx  clear Rapid strep negative  Pt afebrile without tonsillar exudate, negative strep. Presents with mild cervical lymphadenopathy, & dysphagia; diagnosis of viral pharyngitis. No abx indicated. DC w symptomatic tx for pain  Pt does not appear dehydrated, but did discuss importance of water rehydration. Presentation non concerning for PTA or infxn spread to soft tissue. No trismus or uvula deviation. Specific return precautions discussed.   Discharge home with PCP follow-up. Discussed strict return precautions and advised to return to the emergency department if experiencing any new or worsening symptoms. Instructions were understood and patient agreed with discharge plan.  Final Clinical Impressions(s) / ED Diagnoses   Final diagnoses:  Viral URI with cough    New Prescriptions New Prescriptions   No medications on file     Gregary Cromer 10/17/16 Frederik Pear, MD 10/18/16 [phone]

## 2016-10-17 NOTE — Discharge Instructions (Signed)
As discussed, stay well-hydrated and use tea with honey, cough drops, throat sprays to soothe your throat. Get some rest. Follow-up with your primary care provider if symptoms persist. Return if symptoms worsen or he experienced new concerning symptoms in the meantime. Your rapid strep was negative today. No need to start antibiotics at this time.  Take advantage of your work's program for flu shot after you recover.

## 2016-10-17 NOTE — ED Triage Notes (Signed)
Pt complains of sore throat, cough x 2 days. Pt took Nyquil last night, which he states helped and allowed him to sleep.

## 2016-10-20 LAB — CULTURE, GROUP A STREP (THRC)

## 2016-11-29 ENCOUNTER — Encounter (HOSPITAL_COMMUNITY): Payer: Self-pay | Admitting: Emergency Medicine

## 2016-11-29 ENCOUNTER — Emergency Department (HOSPITAL_COMMUNITY)
Admission: EM | Admit: 2016-11-29 | Discharge: 2016-11-29 | Disposition: A | Discharge status: Home / Self Care | Payer: Self-pay | Attending: Emergency Medicine | Admitting: Emergency Medicine

## 2016-11-29 DIAGNOSIS — F17210 Nicotine dependence, cigarettes, uncomplicated: Secondary | ICD-10-CM | POA: Insufficient documentation

## 2016-11-29 DIAGNOSIS — J029 Acute pharyngitis, unspecified: Secondary | ICD-10-CM

## 2016-11-29 DIAGNOSIS — J069 Acute upper respiratory infection, unspecified: Secondary | ICD-10-CM

## 2016-11-29 LAB — RAPID STREP SCREEN (MED CTR MEBANE ONLY): STREPTOCOCCUS, GROUP A SCREEN (DIRECT): NEGATIVE

## 2016-11-29 NOTE — Discharge Instructions (Signed)
Your strep test was negative. Your symptoms are likely from a virus. A viral respiratory infection typically improves in 5-7 days. Take ibuprofen and Tylenol for pain in your throat. Stay well-hydrated. Saltwater gargles will help with throat inflammation. Honey mixed in with warm water will also help with inflammation.

## 2016-11-29 NOTE — ED Triage Notes (Signed)
Patient c/o sore throat x 2 days. Reports there are a lot of people at his work with strep throat.

## 2016-11-29 NOTE — ED Provider Notes (Signed)
[facility]-EMERGENCY DEPT Provider Note   CSN: [id number] Arrival date & time: 11/29/16  [id number]  History   Chief Complaint Chief Complaint  Patient presents with  . Sore Throat    HPI Philip Bowman is a 26 y.o. male with no significant past medical history presents for nasal congestion, rhinorrhea, sore throat and mild dry cough 2 days. No fevers, chills, nausea, vomiting, abdominal pain, diarrhea. States coworkers at his workplace have been sick lately. No sick contacts with strep pharyngitis that he is aware of. Has taken over-the-counter cough medicine with mild relief. No antipyretic prior to arrival.  HPI  History reviewed. No pertinent past medical history.  There are no active problems to display for this patient.   Past Surgical History:  Procedure Laterality Date  . EYE SURGERY Left        Home Medications    Prior to Admission medications   Medication Sig Start Date End Date Taking? Authorizing Provider  ondansetron (ZOFRAN ODT) 8 MG disintegrating tablet 8mg  ODT q4 hours prn nausea 09/25/16   Geoffery Lyonselo, Douglas, MD    Family History No family history on file.  Social History Social History   Tobacco Use  . Smoking status: Current Some Day Smoker    Types: Cigarettes  . Smokeless tobacco: Never Used  Substance Use Topics  . Alcohol use: Yes    Comment: occasionally  . Drug use: No     Allergies   Patient has no known allergies.   Review of Systems Review of Systems  HENT: Positive for congestion, postnasal drip, rhinorrhea and sore throat.   Respiratory: Positive for cough.   All other systems reviewed and are negative.    Physical Exam Updated Vital Signs BP 114/68 (BP Location: Left Arm)   Pulse 87   Temp 98.7 F (37.1 C) (Oral)   Resp 18   Ht 5\' 6"  (1.676 m)   Wt 61.2 kg (135 lb)   SpO2 99%   BMI 21.79 kg/m   Physical Exam  Constitutional: He is oriented to person, place, and time. He appears well-developed  and well-nourished. No distress.  NAD.  HENT:  Head: Normocephalic and atraumatic.  Right Ear: External ear normal.  Left Ear: External ear normal.  Nose: Mucosal edema present.  Mouth/Throat: Mucous membranes are normal. Posterior oropharyngeal erythema present.  Mild erythema to oropharynx, tonsils not visualized No exudate Moist mucous membranes Mild mucosal edema bilaterally  Eyes: Conjunctivae and EOM are normal. No scleral icterus.  Neck: Normal range of motion. Neck supple.  Cardiovascular: Normal rate, regular rhythm, normal heart sounds and intact distal pulses.  No murmur heard. Pulmonary/Chest: Effort normal and breath sounds normal. He has no wheezes.  Musculoskeletal: Normal range of motion. He exhibits no deformity.  Neurological: He is alert and oriented to person, place, and time.  Skin: Skin is warm and dry. Capillary refill takes less than 2 seconds.  Psychiatric: He has a normal mood and affect. His behavior is normal. Judgment and thought content normal.  Nursing note and vitals reviewed.    ED Treatments / Results  Labs (all labs ordered are listed, but only abnormal results are displayed) Labs Reviewed  RAPID STREP SCREEN (NOT AT [facility])    EKG  EKG Interpretation None       Radiology No results found.  Procedures Procedures (including critical care time)  Medications Ordered in ED Medications - No data to display   Initial Impression / Assessment and Plan / ED  Course  I have reviewed the triage vital signs and the nursing notes.  Pertinent labs & imaging results that were available during my care of the patient were reviewed by me and considered in my medical decision making (see chart for details).     26 y.o. -year-old male with no pmh presents with URI like symptoms  2-3 days. Known sick contacts. On my exam patient is nontoxic appearing, speaking in full sentences, w/o increased WOB. No fever, tachypnea, tachycardia, hypoxia. Lungs are  CTAB. I do not think that a CXR is indicated at this time as VS are WNL, there are no signs of consolidation on auscultation and there is no hypoxia. No significant h/o immunocompromise. Doubt pneumonia.  Rapid strep negative. Given reassuring physical exam, will discharge with symptomatic treatment. Strict ED return precautions given. Patient is aware that a viral URI infection may precede pneumonia or worsening illness. Patient is aware of red flag symptoms to monitor for that would warrant return to the ED for further reevaluation.   Final Clinical Impressions(s) / ED Diagnoses   Final diagnoses:  Viral pharyngitis  Viral URI    ED Discharge Orders    None       Jerrell MylarGibbons, Jamen Loiseau J, PA-C 11/29/16 [id number]    Lorre NickAllen, Anthony, MD 11/29/16 [phone]

## 2016-12-01 LAB — CULTURE, GROUP A STREP (THRC)

## 2017-01-05 ENCOUNTER — Emergency Department (HOSPITAL_COMMUNITY): Payer: No Typology Code available for payment source

## 2017-01-05 ENCOUNTER — Emergency Department (HOSPITAL_COMMUNITY)
Admission: EM | Admit: 2017-01-05 | Discharge: 2017-01-05 | Disposition: A | Discharge status: Home / Self Care | Payer: No Typology Code available for payment source | Attending: Emergency Medicine | Admitting: Emergency Medicine

## 2017-01-05 ENCOUNTER — Encounter (HOSPITAL_COMMUNITY): Payer: Self-pay

## 2017-01-05 ENCOUNTER — Other Ambulatory Visit: Payer: Self-pay

## 2017-01-05 DIAGNOSIS — S39012A Strain of muscle, fascia and tendon of lower back, initial encounter: Secondary | ICD-10-CM | POA: Diagnosis not present

## 2017-01-05 DIAGNOSIS — S20219A Contusion of unspecified front wall of thorax, initial encounter: Secondary | ICD-10-CM | POA: Diagnosis not present

## 2017-01-05 DIAGNOSIS — Y999 Unspecified external cause status: Secondary | ICD-10-CM | POA: Insufficient documentation

## 2017-01-05 DIAGNOSIS — Y929 Unspecified place or not applicable: Secondary | ICD-10-CM | POA: Insufficient documentation

## 2017-01-05 DIAGNOSIS — Y9389 Activity, other specified: Secondary | ICD-10-CM | POA: Diagnosis not present

## 2017-01-05 DIAGNOSIS — F17210 Nicotine dependence, cigarettes, uncomplicated: Secondary | ICD-10-CM | POA: Diagnosis not present

## 2017-01-05 MED ORDER — NAPROXEN 500 MG PO TABS: 500.0000 mg | ORAL_TABLET | Freq: Two times a day (BID) | ORAL | 0 refills | Status: DC

## 2017-01-05 MED ORDER — CYCLOBENZAPRINE HCL 10 MG PO TABS: 10.0000 mg | ORAL_TABLET | Freq: Two times a day (BID) | ORAL | 0 refills | Status: DC | PRN
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
## 2017-01-05 NOTE — Discharge Instructions (Signed)
Your xrays are normal today. Take naprosyn for pain. Take flexeril for muscle spasms. Avoid strenuous activity. Follow up with family doctor as needed for recheck if not improving in 3-5 days.

## 2017-01-21 ENCOUNTER — Other Ambulatory Visit: Payer: Self-pay

## 2017-01-21 DIAGNOSIS — F17210 Nicotine dependence, cigarettes, uncomplicated: Secondary | ICD-10-CM | POA: Insufficient documentation

## 2017-01-21 DIAGNOSIS — J069 Acute upper respiratory infection, unspecified: Secondary | ICD-10-CM | POA: Insufficient documentation

## 2017-01-21 DIAGNOSIS — B9789 Other viral agents as the cause of diseases classified elsewhere: Secondary | ICD-10-CM | POA: Insufficient documentation

## 2017-01-21 DIAGNOSIS — Z79899 Other long term (current) drug therapy: Secondary | ICD-10-CM | POA: Insufficient documentation

## 2017-01-21 NOTE — ED Triage Notes (Signed)
Pt c/o dry cough and sore throat x 3 days. Denies fevers.

## 2017-01-22 ENCOUNTER — Emergency Department (HOSPITAL_COMMUNITY)
Admission: EM | Admit: 2017-01-22 | Discharge: 2017-01-22 | Disposition: A | Discharge status: Home / Self Care | Payer: Self-pay | Attending: Emergency Medicine | Admitting: Emergency Medicine

## 2017-01-22 ENCOUNTER — Other Ambulatory Visit: Payer: Self-pay

## 2017-01-22 ENCOUNTER — Encounter (HOSPITAL_COMMUNITY): Payer: Self-pay | Admitting: Emergency Medicine

## 2017-01-22 ENCOUNTER — Emergency Department (HOSPITAL_COMMUNITY): Payer: Self-pay

## 2017-01-22 DIAGNOSIS — J069 Acute upper respiratory infection, unspecified: Secondary | ICD-10-CM

## 2017-01-22 DIAGNOSIS — B9789 Other viral agents as the cause of diseases classified elsewhere: Secondary | ICD-10-CM

## 2017-01-22 LAB — RAPID STREP SCREEN (MED CTR MEBANE ONLY): Streptococcus, Group A Screen (Direct): NEGATIVE

## 2017-01-22 NOTE — ED Provider Notes (Signed)
[facility] EMERGENCY DEPARTMENT Provider Note   CSN: [id number] Arrival date & time: 01/21/17  2353     History   Chief Complaint Chief Complaint  Patient presents with  . Cough    HPI Philip Bowman is a 27 y.o. male.  Patient presents for evaluation of cough, nasal congestion and sore throat for the past 3 days. No fever. He continues to eat and drink as usual. He is a smoker. Cough is non-productive. No nausea, vomiting or headache.   The history is provided by the patient. No language interpreter was used.  Cough  Associated symptoms include sore throat. Pertinent negatives include no chest pain, no myalgias and no shortness of breath.    History reviewed. No pertinent past medical history.  There are no active problems to display for this patient.   Past Surgical History:  Procedure Laterality Date  . EYE SURGERY Left        Home Medications    Prior to Admission medications   Medication Sig Start Date End Date Taking? Authorizing Provider  acetaminophen (TYLENOL) 500 MG tablet Take 1,000 mg by mouth every 6 (six) hours as needed for mild pain.    [provider]  cyclobenzaprine (FLEXERIL) 10 MG tablet Take 1 tablet (10 mg total) by mouth 2 (two) times daily as needed for muscle spasms. 01/05/17   Kirichenko, Tatyana, PA-C  naproxen (NAPROSYN) 500 MG tablet Take 1 tablet (500 mg total) by mouth 2 (two) times daily. 01/05/17   Jaynie Crumble, PA-C    Family History No family history on file.  Social History Social History   Tobacco Use  . Smoking status: Current Some Day Smoker    Types: Cigarettes  . Smokeless tobacco: Never Used  Substance Use Topics  . Alcohol use: Yes    Comment: occasionally  . Drug use: No     Allergies   Patient has no known allergies.   Review of Systems Review of Systems  Constitutional: Negative for appetite change and fever.  HENT: Positive for congestion and sore throat. Negative  for trouble swallowing.   Respiratory: Positive for cough. Negative for shortness of breath.   Cardiovascular: Negative for chest pain.  Gastrointestinal: Negative for nausea and vomiting.  Musculoskeletal: Negative for myalgias.     Physical Exam Updated Vital Signs BP 107/70   Pulse 73   Temp 98.4 F (36.9 C) (Oral)   Resp 16   Ht 5\' 6"  (1.676 m)   Wt 63.5 kg (140 lb)   SpO2 100%   BMI 22.60 kg/m   Physical Exam  Constitutional: He is oriented to person, place, and time. He appears well-developed and well-nourished.  HENT:  Head: Normocephalic.  Mouth/Throat: Oropharynx is clear and moist.  Neck: Normal range of motion. Neck supple.  Cardiovascular: Normal rate and regular rhythm.  No murmur heard. Pulmonary/Chest: Effort normal and breath sounds normal. He has no wheezes. He has no rales.  Musculoskeletal: Normal range of motion.  Lymphadenopathy:    He has no cervical adenopathy.  Neurological: He is alert and oriented to person, place, and time.  Skin: Skin is warm and dry. No rash noted.  Psychiatric: He has a normal mood and affect.     ED Treatments / Results  Labs (all labs ordered are listed, but only abnormal results are displayed) Labs Reviewed  RAPID STREP SCREEN (NOT AT [facility])  CULTURE, GROUP A STREP [facility])    EKG  EKG Interpretation None  Radiology Dg Chest 2 View  Result Date: 01/22/2017 CLINICAL DATA:  Cough and sore throat for 3 days. EXAM: CHEST  2 VIEW COMPARISON:  None. FINDINGS: The heart size and mediastinal contours are within normal limits. Both lungs are clear. The visualized skeletal structures are unremarkable. IMPRESSION: No active cardiopulmonary disease. Electronically Signed   By: Burman NievesWilliam  Stevens M.D.   On: 01/22/2017 05:37    Procedures Procedures (including critical care time)  Medications Ordered in ED Medications - No data to display   Initial Impression / Assessment and Plan / ED Course  I have reviewed the  triage vital signs and the nursing notes.  Pertinent labs & imaging results that were available during my care of the patient were reviewed by me and considered in my medical decision making (see chart for details).     Patient with afebrile URI symptoms x 3 days. He is well appearing. Negative strep and CXR, supporting likely viral etiology.   Final Clinical Impressions(s) / ED Diagnoses   Final diagnoses:  None   1. URI  ED Discharge Orders    None       Elpidio AnisUpstill, Taylorann Tkach, PA-C 01/22/17 [id number]    Glynn Octaveancour, Stephen, MD 01/22/17 [phone]

## 2017-01-24 LAB — CULTURE, GROUP A STREP (THRC)

## 2017-02-15 ENCOUNTER — Other Ambulatory Visit: Payer: Self-pay

## 2017-02-15 ENCOUNTER — Encounter (HOSPITAL_COMMUNITY): Payer: Self-pay | Admitting: Emergency Medicine

## 2017-02-15 DIAGNOSIS — F17210 Nicotine dependence, cigarettes, uncomplicated: Secondary | ICD-10-CM | POA: Insufficient documentation

## 2017-02-15 DIAGNOSIS — Y9289 Other specified places as the place of occurrence of the external cause: Secondary | ICD-10-CM | POA: Insufficient documentation

## 2017-02-15 DIAGNOSIS — S46912A Strain of unspecified muscle, fascia and tendon at shoulder and upper arm level, left arm, initial encounter: Secondary | ICD-10-CM | POA: Insufficient documentation

## 2017-02-15 DIAGNOSIS — Y9389 Activity, other specified: Secondary | ICD-10-CM | POA: Insufficient documentation

## 2017-02-15 DIAGNOSIS — X500XXA Overexertion from strenuous movement or load, initial encounter: Secondary | ICD-10-CM | POA: Insufficient documentation

## 2017-02-15 DIAGNOSIS — Y990 Civilian activity done for income or pay: Secondary | ICD-10-CM | POA: Insufficient documentation

## 2017-02-15 NOTE — ED Triage Notes (Signed)
Pt states he works at [employer] and 2 days ago he was lifting a box over his head and felt like his left shoulder came out of place but he was able to get it back in but has had pain since  Pt has good range of motion to his left arm  Radial pulse strong

## 2017-02-16 ENCOUNTER — Emergency Department (HOSPITAL_COMMUNITY)
Admission: EM | Admit: 2017-02-16 | Discharge: 2017-02-17 | Disposition: A | Discharge status: Home / Self Care | Payer: Self-pay | Attending: Emergency Medicine | Admitting: Emergency Medicine

## 2017-02-16 ENCOUNTER — Emergency Department (HOSPITAL_COMMUNITY): Payer: Self-pay

## 2017-02-16 DIAGNOSIS — S46912A Strain of unspecified muscle, fascia and tendon at shoulder and upper arm level, left arm, initial encounter: Secondary | ICD-10-CM

## 2017-02-16 MED ORDER — NAPROXEN 500 MG PO TABS: 500.0000 mg | ORAL_TABLET | Freq: Two times a day (BID) | ORAL | 0 refills | Status: DC

## 2017-02-16 NOTE — ED Provider Notes (Signed)
[facility]-EMERGENCY DEPT Provider Note   CSN: [id number] Arrival date & time: 02/15/17  2231     History   Chief Complaint Chief Complaint  Patient presents with  . Shoulder Pain    HPI Philip Bowman is a 27 y.o. male.  Patient is here for evaluation of left shoulder pain that has been persistent for 2 days since lifting a heavy object at work. He feels the shoulder popped out of joint and though he was able to pop it back in, it remains sore with limited ROM. No other injury. No neck pain, chest pain, fever, cough, SOB. No numbness or weakness of left arm.    The history is provided by the patient. No language interpreter was used.    History reviewed. No pertinent past medical history.  There are no active problems to display for this patient.   Past Surgical History:  Procedure Laterality Date  . EYE SURGERY Left        Home Medications    Prior to Admission medications   Medication Sig Start Date End Date Taking? Authorizing Provider  acetaminophen (TYLENOL) 500 MG tablet Take 1,000 mg by mouth every 6 (six) hours as needed for mild pain.    [provider]  cyclobenzaprine (FLEXERIL) 10 MG tablet Take 1 tablet (10 mg total) by mouth 2 (two) times daily as needed for muscle spasms. 01/05/17   Kirichenko, Tatyana, PA-C  naproxen (NAPROSYN) 500 MG tablet Take 1 tablet (500 mg total) by mouth 2 (two) times daily. 01/05/17   Jaynie CrumbleKirichenko, Tatyana, PA-C    Family History History reviewed. No pertinent family history.  Social History Social History   Tobacco Use  . Smoking status: Current Some Day Smoker    Types: Cigarettes  . Smokeless tobacco: Never Used  Substance Use Topics  . Alcohol use: Yes    Comment: occasionally  . Drug use: No     Allergies   Patient has no known allergies.   Review of Systems Review of Systems  Constitutional: Negative for fever.  Respiratory: Negative for shortness of breath.     Cardiovascular: Negative for chest pain.  Gastrointestinal: Negative for nausea.  Musculoskeletal:       See HPI.  Skin: Negative for wound.  Neurological: Negative for weakness and numbness.     Physical Exam Updated Vital Signs BP 121/71 (BP Location: Right Arm)   Pulse 76   Temp 98.2 F (36.8 C) (Oral)   Resp 16   Ht 5\' 6"  (1.676 m)   Wt 63.5 kg (140 lb)   SpO2 97%   BMI 22.60 kg/m   Physical Exam  Constitutional: He is oriented to person, place, and time. He appears well-developed and well-nourished.  Neck: Normal range of motion.  Cardiovascular: Intact distal pulses.  Pulmonary/Chest: Effort normal.  Musculoskeletal: Normal range of motion.  Left shoulder is not swollen. There is mild tenderness over AC joint. No joint deformity or AC drop off. No midline or paracervical tenderness.   Neurological: He is alert and oriented to person, place, and time. No sensory deficit.  Skin: Skin is warm and dry.  Psychiatric: He has a normal mood and affect.     ED Treatments / Results  Labs (all labs ordered are listed, but only abnormal results are displayed) Labs Reviewed - No data to display  EKG  EKG Interpretation None       Radiology Dg Shoulder Left  Result Date: 02/16/2017 CLINICAL DATA:  Transient  shoulder dislocation February 12, persistent pain. EXAM: LEFT SHOULDER - 2+ VIEW COMPARISON:  None. FINDINGS: The humeral head is well-formed and located. The subacromial, glenohumeral and acromioclavicular joint spaces are intact. No destructive bony lesions. Soft tissue planes are non-suspicious. IMPRESSION: Negative. Electronically Signed   By: Awilda Metro M.D.   On: 02/16/2017 01:24    Procedures Procedures (including critical care time)  Medications Ordered in ED Medications - No data to display   Initial Impression / Assessment and Plan / ED Course  I have reviewed the triage vital signs and the nursing notes.  Pertinent labs & imaging results  that were available during my care of the patient were reviewed by me and considered in my medical decision making (see chart for details).     Patient is here for left shoulder strain injury. Imaging is negative for acute finding. Recommend supportive care.   Final Clinical Impressions(s) / ED Diagnoses   Final diagnoses:  None   1. Left shoulder sprain  ED Discharge Orders    None       Elpidio Anis, Cordelia Poche 02/16/17 1610    Tegeler, Canary Brim, MD 02/16/17 [phone]

## 2017-02-17 NOTE — ED Notes (Signed)
Pt was discharged 02/16/17 at 0707

## 2017-05-04 ENCOUNTER — Encounter (HOSPITAL_COMMUNITY): Payer: Self-pay

## 2017-05-04 ENCOUNTER — Other Ambulatory Visit: Payer: Self-pay

## 2017-05-04 DIAGNOSIS — R110 Nausea: Secondary | ICD-10-CM | POA: Insufficient documentation

## 2017-05-04 DIAGNOSIS — R197 Diarrhea, unspecified: Secondary | ICD-10-CM | POA: Insufficient documentation

## 2017-05-04 DIAGNOSIS — R1084 Generalized abdominal pain: Secondary | ICD-10-CM | POA: Insufficient documentation

## 2017-05-04 DIAGNOSIS — F17210 Nicotine dependence, cigarettes, uncomplicated: Secondary | ICD-10-CM | POA: Insufficient documentation

## 2017-05-04 NOTE — ED Triage Notes (Signed)
Pt reports diarrhea and abdominal pain and cramping since yesterday. He states that he has vomiting once, but not recently. 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 Ambulatory.

## 2017-05-05 ENCOUNTER — Emergency Department (HOSPITAL_COMMUNITY)
Admission: EM | Admit: 2017-05-05 | Discharge: 2017-05-05 | Disposition: A | Discharge status: Home / Self Care | Payer: Self-pay | Attending: Emergency Medicine | Admitting: Emergency Medicine

## 2017-05-05 DIAGNOSIS — R197 Diarrhea, unspecified: Secondary | ICD-10-CM

## 2017-05-05 DIAGNOSIS — R1084 Generalized abdominal pain: Secondary | ICD-10-CM

## 2017-05-05 DIAGNOSIS — R110 Nausea: Secondary | ICD-10-CM

## 2017-05-05 LAB — COMPREHENSIVE METABOLIC PANEL
ALBUMIN: 4.4 g/dL (ref 3.5–5.0)
ALT: 25 U/L (ref 17–63)
AST: 25 U/L (ref 15–41)
Alkaline Phosphatase: 101 U/L (ref 38–126)
Anion gap: 10 (ref 5–15)
BUN: 13 mg/dL (ref 6–20)
CHLORIDE: 106 mmol/L (ref 101–111)
CO2: 24 mmol/L (ref 22–32)
CREATININE: 0.84 mg/dL (ref 0.61–1.24)
Calcium: 10.1 mg/dL (ref 8.9–10.3)
GFR calc Af Amer: >60 mL/min (ref >60)
GLUCOSE: 104 mg/dL — AB (ref 65–99)
Potassium: 4 mmol/L (ref 3.5–5.1)
Sodium: 140 mmol/L (ref 135–145)
Total Bilirubin: 1 mg/dL (ref 0.3–1.2)
Total Protein: 7.7 g/dL (ref 6.5–8.1)

## 2017-05-05 LAB — URINALYSIS, ROUTINE W REFLEX MICROSCOPIC
BILIRUBIN URINE: NEGATIVE
Glucose, UA: NEGATIVE mg/dL
HGB URINE DIPSTICK: NEGATIVE
Ketones, ur: NEGATIVE mg/dL
LEUKOCYTES UA: NEGATIVE
Nitrite: NEGATIVE
PROTEIN: NEGATIVE mg/dL
Specific Gravity, Urine: 1.021 (ref 1.005–1.030)
pH: 7 (ref 5.0–8.0)

## 2017-05-05 LAB — CBC
HCT: 45.5 % (ref 39.0–52.0)
Hemoglobin: 16 g/dL (ref 13.0–17.0)
MCH: 33.1 pg (ref 26.0–34.0)
MCHC: 35.2 g/dL (ref 30.0–36.0)
MCV: 94.2 fL (ref 78.0–100.0)
PLATELETS: 261 10*3/uL (ref 150–400)
RBC: 4.83 MIL/uL (ref 4.22–5.81)
RDW: 12.4 % (ref 11.5–15.5)
WBC: 9.1 10*3/uL (ref 4.0–10.5)

## 2017-05-05 LAB — LIPASE, BLOOD: LIPASE: 28 U/L (ref 11–51)

## 2017-05-05 MED ORDER — LOPERAMIDE HCL 2 MG PO CAPS: 2.0000 mg | ORAL_CAPSULE | Freq: Four times a day (QID) | ORAL | 0 refills | Status: DC | PRN

## 2017-05-05 MED ORDER — ONDANSETRON 4 MG PO TBDP: 4.0000 mg | ORAL_TABLET | Freq: Three times a day (TID) | ORAL | 0 refills | Status: DC | PRN

## 2017-05-05 MED ORDER — LOPERAMIDE HCL 2 MG PO CAPS
2.0000 mg | ORAL_CAPSULE | Freq: Once | ORAL | Status: AC
  Administered 2017-05-05: 2 mg via ORAL
  Filled 2017-05-05: qty 1

## 2017-05-05 MED ORDER — ONDANSETRON 4 MG PO TBDP
4.0000 mg | ORAL_TABLET | Freq: Once | ORAL | Status: AC
  Administered 2017-05-05: 4 mg via ORAL
  Filled 2017-05-05: qty 1

## 2017-05-05 NOTE — Discharge Instructions (Addendum)
1. Medications: zofran, Imodium, usual home medications °2. Treatment: rest, drink plenty of fluids, advance diet slowly °3. Follow Up: Please followup with your primary doctor in 2 days for discussion of your diagnoses and further evaluation after today's visit; if you do not have a primary care doctor use the resource guide provided to find one; Please return to the ER for persistent vomiting, high fevers or worsening symptoms ° °

## 2017-05-05 NOTE — ED Provider Notes (Signed)
[facility]-EMERGENCY DEPT Provider Note   CSN: [id number] Arrival date & time: 05/04/17  2237     History   Chief Complaint Chief Complaint  Patient presents with  . Abdominal Pain  . Diarrhea    HPI Philip Bowman is a 27 y.o. male with a hx of major medical problems presents to the Emergency Department complaining of gradual, intermittent watery diarrhea onset yesterday morning around 10 AM.  Patient reports he has had some abdominal cramping which occurs just prior to defecation.  He reports pain is alleviated after his bowel movement.  He denies bloody, black or tarry stools.  He states he has several coworkers and a daughter who have been sick with similar symptoms.  Patient had one episode of emesis yesterday but no additional emesis episodes.  He reports emesis was nonbloody and nonbilious.  Patient reports he has been able to eat and drink today however he believes this has increased the number of stools.  He has taken Pepto-Bismol without relief.  Patient denies fever, chills, headache, neck pain, chest pain, shortness of breath, weakness, dizziness, syncope, dysuria.  Patient denies previous abdominal surgeries.  He denies recent international travel.  The history is provided by the patient and medical records. No language interpreter was used.    History reviewed. No pertinent past medical history.  There are no active problems to display for this patient.   Past Surgical History:  Procedure Laterality Date  . EYE SURGERY Left         Home Medications    Prior to Admission medications   Medication Sig Start Date End Date Taking? Authorizing Provider  acetaminophen (TYLENOL) 500 MG tablet Take 1,000 mg by mouth every 6 (six) hours as needed for mild pain.   Yes [provider]  loperamide (IMODIUM) 2 MG capsule Take 1 capsule (2 mg total) by mouth 4 (four) times daily as needed for diarrhea or loose stools. 05/05/17   Izzak Fries,  Dahlia Client, PA-C  ondansetron (ZOFRAN-ODT) 4 MG disintegrating tablet Take 1 tablet (4 mg total) by mouth every 8 (eight) hours as needed for nausea or vomiting. 05/05/17   Shelise Maron, Boyd Kerbs    Family History History reviewed. No pertinent family history.  Social History Social History   Tobacco Use  . Smoking status: Current Some Day Smoker    Types: Cigarettes  . Smokeless tobacco: Never Used  Substance Use Topics  . Alcohol use: Yes    Comment: occasionally  . Drug use: No     Allergies   Patient has no known allergies.   Review of Systems Review of Systems  Constitutional: Negative for appetite change, diaphoresis, fatigue, fever and unexpected weight change.  HENT: Negative for mouth sores.   Eyes: Negative for visual disturbance.  Respiratory: Negative for cough, chest tightness, shortness of breath and wheezing.   Cardiovascular: Negative for chest pain.  Gastrointestinal: Positive for abdominal pain, diarrhea, nausea and vomiting. Negative for constipation.  Endocrine: Negative for polydipsia, polyphagia and polyuria.  Genitourinary: Negative for dysuria, frequency, hematuria and urgency.  Musculoskeletal: Negative for back pain and neck stiffness.  Skin: Negative for rash.  Allergic/Immunologic: Negative for immunocompromised state.  Neurological: Negative for syncope, light-headedness and headaches.  Hematological: Does not bruise/bleed easily.  Psychiatric/Behavioral: Negative for sleep disturbance. The patient is not nervous/anxious.      Physical Exam Updated Vital Signs BP 117/75 (BP Location: Right Arm)   Pulse 65   Temp 98.1 F (36.7 C) (Oral)  Resp 18   SpO2 99%   Physical Exam  Constitutional: He appears well-developed and well-nourished. No distress.  Awake, alert, nontoxic appearance  HENT:  Head: Normocephalic and atraumatic.  Mouth/Throat: Oropharynx is clear and moist. No oropharyngeal exudate.  His mucous membranes are moist    Eyes: Conjunctivae are normal. No scleral icterus.  Neck: Normal range of motion. Neck supple.  Cardiovascular: Normal rate, regular rhythm and intact distal pulses.  Pulmonary/Chest: Effort normal and breath sounds normal. No respiratory distress. He has no wheezes.  Equal chest expansion  Abdominal: Soft. He exhibits no distension and no mass. Bowel sounds are increased. There is no tenderness. There is no rebound and no guarding.  Soft and nontender. Nondistended.  Musculoskeletal: Normal range of motion. He exhibits no edema.  Neurological: He is alert.  Speech is clear and goal oriented Moves extremities without ataxia  Skin: Skin is warm and dry. He is not diaphoretic.  Psychiatric: He has a normal mood and affect.  Nursing note and vitals reviewed.    ED Treatments / Results  Labs (all labs ordered are listed, but only abnormal results are displayed) Labs Reviewed  COMPREHENSIVE METABOLIC PANEL - Abnormal; Notable for the following components:      Result Value   Glucose, Bld 104 (*)    All other components within normal limits  URINALYSIS, ROUTINE W REFLEX MICROSCOPIC - Abnormal; Notable for the following components:   APPearance HAZY (*)    All other components within normal limits  LIPASE, BLOOD  CBC     Procedures Procedures (including critical care time)  Medications Ordered in ED Medications  loperamide (IMODIUM) capsule 2 mg (has no administration in time range)  ondansetron (ZOFRAN-ODT) disintegrating tablet 4 mg (has no administration in time range)     Initial Impression / Assessment and Plan / ED Course  I have reviewed the triage vital signs and the nursing notes.  Pertinent labs & imaging results that were available during my care of the patient were reviewed by me and considered in my medical decision making (see chart for details).     Patient with symptoms consistent with viral gastroenteritis.  Vitals are stable, no fever, no tachycardia or  tachypnea.  No signs of dehydration, tolerating PO fluids > 6 oz.  Lungs are clear.  No focal abdominal pain, no concern for appendicitis, cholecystitis, pancreatitis, ruptured viscus, UTI, kidney stone, or any other abdominal etiology.  Labs are reassuring.  No electrolyte abnormalities.  No hypokalemia.  Normal lipase, normal AST/ALT.  No leukocytosis.  No evidence of sirs or sepsis.  Urine without increased specific gravity to suggest dehydration.  His mucous membranes are moist.  Supportive therapy indicated with return if symptoms worsen.  Patient counseled and states understanding.  He is in agreement with this plan.   Final Clinical Impressions(s) / ED Diagnoses   Final diagnoses:  Diarrhea of presumed infectious origin  Generalized abdominal pain  Nausea    ED Discharge Orders        Ordered    loperamide (IMODIUM) 2 MG capsule  4 times daily PRN     05/05/17 0134    ondansetron (ZOFRAN-ODT) 4 MG disintegrating tablet  Every 8 hours PRN     05/05/17 0134       Ryshawn Sanzone, Dahlia Client, PA-C 05/05/17 0135    Rolland Porter, MD 05/06/17 [phone]

## 2017-05-25 ENCOUNTER — Other Ambulatory Visit: Payer: Self-pay

## 2017-05-25 ENCOUNTER — Encounter (HOSPITAL_COMMUNITY): Payer: Self-pay | Admitting: Emergency Medicine

## 2017-05-25 ENCOUNTER — Emergency Department (HOSPITAL_COMMUNITY)
Admission: EM | Admit: 2017-05-25 | Discharge: 2017-05-25 | Disposition: A | Discharge status: Home / Self Care | Payer: Self-pay | Attending: Emergency Medicine | Admitting: Emergency Medicine

## 2017-05-25 DIAGNOSIS — Y999 Unspecified external cause status: Secondary | ICD-10-CM | POA: Insufficient documentation

## 2017-05-25 DIAGNOSIS — S39012A Strain of muscle, fascia and tendon of lower back, initial encounter: Secondary | ICD-10-CM | POA: Insufficient documentation

## 2017-05-25 DIAGNOSIS — X500XXA Overexertion from strenuous movement or load, initial encounter: Secondary | ICD-10-CM | POA: Insufficient documentation

## 2017-05-25 DIAGNOSIS — Y93E6 Activity, residential relocation: Secondary | ICD-10-CM | POA: Insufficient documentation

## 2017-05-25 DIAGNOSIS — Y929 Unspecified place or not applicable: Secondary | ICD-10-CM | POA: Insufficient documentation

## 2017-05-25 DIAGNOSIS — F17210 Nicotine dependence, cigarettes, uncomplicated: Secondary | ICD-10-CM | POA: Insufficient documentation

## 2017-05-25 MED ORDER — CYCLOBENZAPRINE HCL 10 MG PO TABS: 5.0000 mg | ORAL_TABLET | Freq: Two times a day (BID) | ORAL | 0 refills | Status: DC | PRN

## 2017-05-25 MED ORDER — MELOXICAM 15 MG PO TABS: 15.0000 mg | ORAL_TABLET | Freq: Every day | ORAL | 0 refills | Status: DC

## 2017-05-25 NOTE — ED Provider Notes (Signed)
[facility]-EMERGENCY DEPT Provider Note   CSN: [id number] Arrival date & time: 05/25/17  1936     History   Chief Complaint Chief Complaint  Patient presents with  . Back Pain    HPI Philip Bowman is a 27 y.o. male.  Who presents emergency department chief complaint of low back pain.  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  Pain is worse on the right.  He is fine at rest but it is worse with movement, twisting and bending forward.  He denies any weakness, numbness, loss of bowel or bladder control, saddle anesthesia.  HPI  History reviewed. No pertinent past medical history.  There are no active problems to display for this patient.   Past Surgical History:  Procedure Laterality Date  . EYE SURGERY Left         Home Medications    Prior to Admission medications   Medication Sig Start Date End Date Taking? Authorizing Provider  acetaminophen (TYLENOL) 500 MG tablet Take 1,000 mg by mouth every 6 (six) hours as needed for mild pain.    [provider]  loperamide (IMODIUM) 2 MG capsule Take 1 capsule (2 mg total) by mouth 4 (four) times daily as needed for diarrhea or loose stools. 05/05/17   Muthersbaugh, Dahlia Client, PA-C  ondansetron (ZOFRAN-ODT) 4 MG disintegrating tablet Take 1 tablet (4 mg total) by mouth every 8 (eight) hours as needed for nausea or vomiting. 05/05/17   Muthersbaugh, Boyd Kerbs    Family History History reviewed. No pertinent family history.  Social History Social History   Tobacco Use  . Smoking status: Current Some Day Smoker    Types: Cigarettes  . Smokeless tobacco: Never Used  Substance Use Topics  . Alcohol use: Not Currently    Comment: occasionally  . Drug use: No     Allergies   Patient has no known allergies.   Review of Systems Review of Systems Positive for back pain, negative for weakness, numbness,  saddle anesthesia, bowel or bladder incontinence  Physical Exam Updated Vital Signs BP (!) 103/56 (BP Location: Right Arm)   Pulse 71   Temp 97.8 F (36.6 C) (Oral)   Resp 18   SpO2 99%   Physical Exam  Constitutional: He appears well-developed and well-nourished. No distress.  HENT:  Head: Normocephalic and atraumatic.  Eyes: Conjunctivae are normal. No scleral icterus.  Neck: Normal range of motion. Neck supple.  Cardiovascular: Normal rate, regular rhythm and normal heart sounds.  Pulmonary/Chest: Effort normal and breath sounds normal. No respiratory distress.  Abdominal: Soft. There is no tenderness.  Musculoskeletal: He exhibits no edema.  Patient appears to be in mild to moderate pain, antalgic gait noted. Lumbosacral spine area reveals no local tenderness or mass. Painful and reduced LS ROM noted. Straight leg raise is negative. DTR's, motor strength and sensation normal, including heel and toe gait.  Peripheral pulses are palpable.   Neurological: He is alert.  Skin: Skin is warm and dry. He is not diaphoretic.  Psychiatric: His behavior is normal.  Nursing note and vitals reviewed.    ED Treatments / Results  Labs (all labs ordered are listed, but only abnormal results are displayed) Labs Reviewed - No data to display  EKG None  Radiology No results found.  Procedures Procedures (including critical care time)  Medications Ordered in ED Medications - No data to display  Initial Impression / Assessment and Plan / ED Course  I have reviewed the triage vital signs and the nursing notes.  Pertinent labs & imaging results that were available during my care of the patient were reviewed by me and considered in my medical decision making (see chart for details).     Patient with back pain.  No neurological deficits and normal neuro exam.  Patient can walk but states is painful.  No loss of bowel or bladder control.  No concern for cauda equina.  No fever,  night sweats, weight loss, h/o cancer, IVDU.  RICE protocol and pain medicine indicated and discussed with patient.    Final Clinical Impressions(s) / ED Diagnoses   Final diagnoses:  Strain of lumbar region, initial encounter    ED Discharge Orders    None       Arthor Captain, PA-C 05/25/17 2239    Bethann Berkshire, MD 05/25/17 2327

## 2017-05-25 NOTE — ED Notes (Signed)
Pt blew vape smoke into this nurse's face when being discharged. Pt advised on hospital policies.

## 2017-05-25 NOTE — ED Triage Notes (Signed)
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

## 2017-05-25 NOTE — Discharge Instructions (Addendum)
SEEK IMMEDIATE MEDICAL ATTENTION IF: New numbness, tingling, weakness, or problem with the use of your arms or legs.  Severe back pain not relieved with medications.  Change in bowel or bladder control.  Increasing pain in any areas of the body (such as chest or abdominal pain).  Shortness of breath, dizziness or fainting.  Nausea (feeling sick to your stomach), vomiting, fever, or sweats.  

## 2017-05-25 NOTE — ED Notes (Signed)
Pt ambulated to the restroom in no distress ?

## 2017-06-08 ENCOUNTER — Other Ambulatory Visit: Payer: Self-pay

## 2017-06-08 ENCOUNTER — Emergency Department (HOSPITAL_COMMUNITY)
Admission: EM | Admit: 2017-06-08 | Discharge: 2017-06-08 | Disposition: A | Discharge status: Home / Self Care | Payer: Managed Care, Other (non HMO) | Attending: Emergency Medicine | Admitting: Emergency Medicine

## 2017-06-08 ENCOUNTER — Encounter (HOSPITAL_COMMUNITY): Payer: Self-pay

## 2017-06-08 DIAGNOSIS — J069 Acute upper respiratory infection, unspecified: Secondary | ICD-10-CM | POA: Diagnosis not present

## 2017-06-08 DIAGNOSIS — J111 Influenza due to unidentified influenza virus with other respiratory manifestations: Secondary | ICD-10-CM | POA: Diagnosis not present

## 2017-06-08 DIAGNOSIS — R059 Cough, unspecified: Secondary | ICD-10-CM

## 2017-06-08 DIAGNOSIS — R69 Illness, unspecified: Secondary | ICD-10-CM

## 2017-06-08 DIAGNOSIS — R05 Cough: Secondary | ICD-10-CM | POA: Insufficient documentation

## 2017-06-08 DIAGNOSIS — R509 Fever, unspecified: Secondary | ICD-10-CM | POA: Diagnosis present

## 2017-06-08 DIAGNOSIS — R590 Localized enlarged lymph nodes: Secondary | ICD-10-CM | POA: Diagnosis not present

## 2017-06-08 DIAGNOSIS — J020 Streptococcal pharyngitis: Secondary | ICD-10-CM | POA: Insufficient documentation

## 2017-06-08 DIAGNOSIS — J029 Acute pharyngitis, unspecified: Secondary | ICD-10-CM

## 2017-06-08 LAB — GROUP A STREP BY PCR: Group A Strep by PCR: DETECTED — AB

## 2017-06-08 MED ORDER — PENICILLIN G BENZATHINE 1200000 UNIT/2ML IM SUSP
1.2000 10*6.[IU] | Freq: Once | INTRAMUSCULAR | Status: AC
  Administered 2017-06-08: 1.2 10*6.[IU] via INTRAMUSCULAR
  Filled 2017-06-08: qty 2

## 2017-06-08 NOTE — Discharge Instructions (Addendum)
You have strep throat, but you were treated for this today in the ER so you should start to improve over the next several days. Continue to stay well-hydrated. Gargle warm salt water and spit it out and use chloraseptic spray as needed for sore throat. Continue to alternate between Tylenol and Ibuprofen for pain or fever. Use Mucinex for cough suppression/expectoration of mucus. Use netipot and flonase to help with nasal congestion. May consider over-the-counter Benadryl or other antihistamine to decrease secretions and for help with your symptoms. Follow up with your primary care doctor in 5-7 days for recheck of ongoing symptoms. Return to emergency department for emergent changing or worsening of symptoms.

## 2017-06-08 NOTE — ED Triage Notes (Signed)
Patient states he had a fever yesterday, generalized body aches and a sore throat that began today.

## 2017-06-08 NOTE — ED Provider Notes (Signed)
[facility]-EMERGENCY DEPT Provider Note   CSN: [id number] Arrival date & time: 06/08/17  1723     History   Chief Complaint Chief Complaint  Patient presents with  . Sore Throat  . Fever  . Generalized Body Aches    HPI Philip Bowman is a 27 y.o. otherwise healthy male, who presents to the ED with complaints of URI symptoms that began yesterday.  Patient states that he started feeling "sick" last night, and this morning he woke up having body aches, chills, sore throat, rhinorrhea, dry cough, and fever with Tmax of "101 point something".  He took Tylenol about 4 hours prior to arrival and this helped somewhat, some times worsen with swallowing.  His daughter was recently sick and he thinks that she could have given him something.  He is a non-smoker.  He denies any drooling, trismus, ear pain or drainage, CP, SOB, abd pain, N/V/D/C, hematuria, dysuria, numbness, tingling, focal weakness, or any other complaints at this time.   The history is provided by the patient and medical records. No language interpreter was used.  Sore Throat  Pertinent negatives include no chest pain, no abdominal pain and no shortness of breath.  Fever   Associated symptoms include sore throat and cough. Pertinent negatives include no chest pain, no diarrhea and no vomiting.    History reviewed. No pertinent past medical history.  There are no active problems to display for this patient.   Past Surgical History:  Procedure Laterality Date  . EYE SURGERY Left         Home Medications    Prior to Admission medications   Medication Sig Start Date End Date Taking? Authorizing Provider  acetaminophen (TYLENOL) 500 MG tablet Take 1,000 mg by mouth every 6 (six) hours as needed for mild pain.    [provider]  cyclobenzaprine (FLEXERIL) 10 MG tablet Take 0.5-1 tablets (5-10 mg total) by mouth 2 (two) times daily as needed for muscle spasms. 05/25/17   Arthor Captain,  PA-C  loperamide (IMODIUM) 2 MG capsule Take 1 capsule (2 mg total) by mouth 4 (four) times daily as needed for diarrhea or loose stools. 05/05/17   Muthersbaugh, Dahlia Client, PA-C  meloxicam (MOBIC) 15 MG tablet Take 1 tablet (15 mg total) by mouth daily. Take 1 daily with food. 05/25/17   Harris, Abigail, PA-C  ondansetron (ZOFRAN-ODT) 4 MG disintegrating tablet Take 1 tablet (4 mg total) by mouth every 8 (eight) hours as needed for nausea or vomiting. 05/05/17   Muthersbaugh, Boyd Kerbs    Family History History reviewed. No pertinent family history.  Social History Social History   Tobacco Use  . Smoking status: Current Some Day Smoker    Types: Cigarettes  . Smokeless tobacco: Never Used  Substance Use Topics  . Alcohol use: Not Currently  . Drug use: No     Allergies   Patient has no known allergies.   Review of Systems Review of Systems  Constitutional: Positive for chills and fever.  HENT: Positive for rhinorrhea and sore throat. Negative for drooling, ear discharge, ear pain and trouble swallowing.   Respiratory: Positive for cough. Negative for shortness of breath.   Cardiovascular: Negative for chest pain.  Gastrointestinal: Negative for abdominal pain, constipation, diarrhea, nausea and vomiting.  Genitourinary: Negative for dysuria and hematuria.  Musculoskeletal: Positive for myalgias. Negative for arthralgias.  Skin: Negative for color change.  Allergic/Immunologic: Negative for immunocompromised state.  Neurological: Negative for weakness and numbness.  Psychiatric/Behavioral: Negative for confusion.   All other systems reviewed and are negative for acute change except as noted in the HPI.    Physical Exam Updated Vital Signs BP 125/69 (BP Location: Left Arm)   Pulse 79   Temp 98.4 F (36.9 C) (Oral)   Resp 15   Ht 5\' 7"  (1.702 m)   Wt 61.2 kg (135 lb)   SpO2 98%   BMI 21.14 kg/m   Physical Exam  Constitutional: He is oriented to person, place, and  time. Vital signs are normal. He appears well-developed and well-nourished.  Non-toxic appearance. No distress.  Afebrile, nontoxic, NAD  HENT:  Head: Normocephalic and atraumatic.  Nose: Mucosal edema and rhinorrhea present.  Mouth/Throat: Uvula is midline and mucous membranes are normal. No trismus in the jaw. No uvula swelling. Posterior oropharyngeal erythema present. No oropharyngeal exudate, posterior oropharyngeal edema or tonsillar abscesses. Tonsils are 0 on the right. Tonsils are 0 on the left. No tonsillar exudate.  Nose with mild mucosal edema and rhinorrhea. Oropharynx mildly injected, without uvular swelling or deviation, no trismus or drooling, no tonsillar swelling, no exudates. No PTA   Eyes: Conjunctivae and EOM are normal. Right eye exhibits no discharge. Left eye exhibits no discharge.  Neck: Normal range of motion. Neck supple.  Cardiovascular: Normal rate, regular rhythm, normal heart sounds and intact distal pulses. Exam reveals no gallop and no friction rub.  No murmur heard. Pulmonary/Chest: Effort normal and breath sounds normal. No respiratory distress. He has no decreased breath sounds. He has no wheezes. He has no rhonchi. He has no rales.  CTAB in all lung fields, no w/r/r, no hypoxia or increased WOB, speaking in full sentences, SpO2 98% on RA   Abdominal: Soft. Normal appearance and bowel sounds are normal. He exhibits no distension. There is no tenderness. There is no rigidity, no rebound, no guarding, no CVA tenderness, no tenderness at McBurney's point and negative Murphy's sign.  Musculoskeletal: Normal range of motion.  Lymphadenopathy:       Head (right side): Tonsillar adenopathy present.       Head (left side): Tonsillar adenopathy present.    He has cervical adenopathy.  Shotty cervical and tonsillar LAD bilaterally which is mildly TTP  Neurological: He is alert and oriented to person, place, and time. He has normal strength. No sensory deficit.  Skin:  Skin is warm, dry and intact. No rash noted.  Psychiatric: He has a normal mood and affect.  Nursing note and vitals reviewed.    ED Treatments / Results  Labs (all labs ordered are listed, but only abnormal results are displayed) Labs Reviewed  GROUP A STREP BY PCR - Abnormal; Notable for the following components:      Result Value   Group A Strep by PCR DETECTED (*)    All other components within normal limits    EKG None  Radiology No results found.  Procedures Procedures (including critical care time)  Medications Ordered in ED Medications  penicillin g benzathine (BICILLIN LA) 1200000 UNIT/2ML injection 1.2 Million Units (1.2 Million Units Intramuscular Given 06/08/17 2114)     Initial Impression / Assessment and Plan / ED Course  I have reviewed the triage vital signs and the nursing notes.  Pertinent labs & imaging results that were available during my care of the patient were reviewed by me and considered in my medical decision making (see chart for details).     27 y.o. male here with body aches, fever/chills, sore  throat, and URI symptoms x1 day. +Sick contacts recently. On exam, throat mildly injected but no tonsillar swelling or exudates, clear lungs, afebrile and nontoxic, shotty cervical and tonsillar LAD bilaterally which is mildly TTP, overall well appearing. Will get strep test, although fairly low CENTOR score; doubt need for CXR given reassuring pulmonary exam. Will reassess shortly.   9:16 PM Strep test positive, which would explain his symptoms and fever. Pt opted for bicillin rather than PO abx, this was given here. Otherwise, discussed use of OTC remedies for symptomatic treatment, and follow up with PCP in 1wk for recheck. I explained the diagnosis and have given explicit precautions to return to the ER including for any other new or worsening symptoms. The patient understands and accepts the medical plan as it's been dictated and I have answered their  questions. Discharge instructions concerning home care and prescriptions have been given. The patient is STABLE and is discharged to home in good condition.     Final Clinical Impressions(s) / ED Diagnoses   Final diagnoses:  Strep pharyngitis  Upper respiratory tract infection, unspecified type  Sore throat  Influenza-like illness  Cough    ED Discharge Orders    [address] [place], [place] 06/08/17 2116    Melene Plan, DO 06/08/17 2131

## 2017-07-11 ENCOUNTER — Other Ambulatory Visit: Payer: Self-pay

## 2017-07-11 ENCOUNTER — Encounter (HOSPITAL_BASED_OUTPATIENT_CLINIC_OR_DEPARTMENT_OTHER): Payer: Self-pay | Admitting: *Deleted

## 2017-07-11 DIAGNOSIS — R197 Diarrhea, unspecified: Secondary | ICD-10-CM | POA: Diagnosis not present

## 2017-07-11 DIAGNOSIS — Z79899 Other long term (current) drug therapy: Secondary | ICD-10-CM | POA: Insufficient documentation

## 2017-07-11 DIAGNOSIS — F17200 Nicotine dependence, unspecified, uncomplicated: Secondary | ICD-10-CM | POA: Diagnosis not present

## 2017-07-11 NOTE — ED Triage Notes (Addendum)
Pt c/o vomiting yesterday and diarrhea today, family at home with same, pt needs work note

## 2017-07-12 ENCOUNTER — Encounter (HOSPITAL_BASED_OUTPATIENT_CLINIC_OR_DEPARTMENT_OTHER): Payer: Self-pay | Admitting: Emergency Medicine

## 2017-07-12 ENCOUNTER — Emergency Department (HOSPITAL_BASED_OUTPATIENT_CLINIC_OR_DEPARTMENT_OTHER)
Admission: EM | Admit: 2017-07-12 | Discharge: 2017-07-12 | Disposition: A | Discharge status: Home / Self Care | Payer: Managed Care, Other (non HMO) | Attending: Emergency Medicine | Admitting: Emergency Medicine

## 2017-07-12 DIAGNOSIS — R197 Diarrhea, unspecified: Secondary | ICD-10-CM

## 2017-07-12 NOTE — ED Provider Notes (Addendum)
[facility] EMERGENCY DEPARTMENT Provider Note   CSN: [id number] Arrival date & time: 07/11/17  2338     History   Chief Complaint Chief Complaint  Patient presents with  . Diarrhea    HPI Philip Bowman is a 27 y.o. male.  The history is provided by the patient.  Diarrhea   This is a new problem. The current episode started 6 to 12 hours ago. The problem occurs 2 to 4 times per day. The problem has been rapidly improving. The stool consistency is described as watery. There has been no fever. Pertinent negatives include no abdominal pain, no vomiting, no sweats and no headaches. Associated symptoms comments: Vomiting resolved yesterday. He has tried nothing for the symptoms. The treatment provided significant relief. Risk factors include ill contacts (all family members with same.  ). His past medical history does not include irritable bowel syndrome.  Is predominantly here for a work note.  No pain, no f/c/r.  No vomiting.    History reviewed. No pertinent past medical history.  There are no active problems to display for this patient.   Past Surgical History:  Procedure Laterality Date  . EYE SURGERY Left         Home Medications    Prior to Admission medications   Medication Sig Start Date End Date Taking? Authorizing Provider  acetaminophen (TYLENOL) 500 MG tablet Take 1,000 mg by mouth every 6 (six) hours as needed for mild pain.    [provider]  cyclobenzaprine (FLEXERIL) 10 MG tablet Take 0.5-1 tablets (5-10 mg total) by mouth 2 (two) times daily as needed for muscle spasms. 05/25/17   Arthor CaptainHarris, Abigail, PA-C  loperamide (IMODIUM) 2 MG capsule Take 1 capsule (2 mg total) by mouth 4 (four) times daily as needed for diarrhea or loose stools. 05/05/17   Muthersbaugh, Dahlia ClientHannah, PA-C  meloxicam (MOBIC) 15 MG tablet Take 1 tablet (15 mg total) by mouth daily. Take 1 daily with food. 05/25/17   Harris, Abigail, PA-C  ondansetron (ZOFRAN-ODT) 4 MG  disintegrating tablet Take 1 tablet (4 mg total) by mouth every 8 (eight) hours as needed for nausea or vomiting. 05/05/17   Muthersbaugh, Boyd KerbsHannah, PA-C    Family History History reviewed. No pertinent family history.  Social History Social History   Tobacco Use  . Smoking status: Current Some Day Smoker    Packs/day: 0.50    Types: Cigars  . Smokeless tobacco: Never Used  Substance Use Topics  . Alcohol use: Not Currently  . Drug use: No     Allergies   Patient has no known allergies.   Review of Systems Review of Systems  Gastrointestinal: Positive for diarrhea. Negative for abdominal pain and vomiting.  Neurological: Negative for headaches.  All other systems reviewed and are negative.    Physical Exam Updated Vital Signs BP 121/77   Pulse 73   Temp 98.5 F (36.9 C)   Resp 16   Ht 5\' 7"  (1.702 m)   Wt 61.2 kg (135 lb)   SpO2 100%   BMI 21.14 kg/m   Physical Exam  Constitutional: He is oriented to person, place, and time. He appears well-developed and well-nourished. No distress.  HENT:  Head: Normocephalic and atraumatic.  Mouth/Throat: No oropharyngeal exudate.  Eyes: Pupils are equal, round, and reactive to light. Conjunctivae are normal.  Neck: Normal range of motion. Neck supple.  Cardiovascular: Normal rate, regular rhythm, normal heart sounds and intact distal pulses.  Pulmonary/Chest: Effort normal and  breath sounds normal. No stridor. He has no wheezes. He has no rales.  Abdominal: Soft. Bowel sounds are normal. He exhibits no mass. There is no tenderness. There is no rebound and no guarding.  Musculoskeletal: Normal range of motion.  Neurological: He is alert and oriented to person, place, and time. He displays normal reflexes.  Skin: Skin is warm and dry. Capillary refill takes less than 2 seconds.  Psychiatric: He has a normal mood and affect.     ED Treatments / Results  Labs (all labs ordered are listed, but only abnormal results are  displayed) Labs Reviewed - No data to display  EKG None  Radiology No results found.  Procedures Procedures (including critical care time)   Final Clinical Impressions(s) / ED Diagnoses   Final diagnoses:  Diarrhea, unspecified type    Note given.  Return for pain, numbness, changes in vision or speech, fevers >100.4 unrelieved by medication, shortness of breath, intractable vomiting, or diarrhea, abdominal pain, Inability to tolerate liquids or food, cough, altered mental status or any concerns. No signs of systemic illness or infection. The patient is nontoxic-appearing on exam and vital signs are within normal limits. Will refer to urology for microscopy hematuria as patient is asymptomatic.  I have reviewed the triage vital signs and the nursing notes. Pertinent labs &imaging results that were available during my care of the patient were reviewed by me and considered in my medical decision making (see chart for details).  After history, exam, and medical workup I feel the patient has been appropriately medically screened and is safe for discharge home. Pertinent diagnoses were discussed with the patient. Patient was given return precautions.    Ariez Neilan, MD 07/12/17 1610    Cy Blamer, MD 07/12/17 9604

## 2017-08-06 ENCOUNTER — Emergency Department (HOSPITAL_COMMUNITY)
Admission: EM | Admit: 2017-08-06 | Discharge: 2017-08-06 | Disposition: A | Discharge status: Home / Self Care | Payer: Managed Care, Other (non HMO) | Attending: Emergency Medicine | Admitting: Emergency Medicine

## 2017-08-06 ENCOUNTER — Other Ambulatory Visit: Payer: Self-pay

## 2017-08-06 ENCOUNTER — Encounter (HOSPITAL_COMMUNITY): Payer: Self-pay

## 2017-08-06 DIAGNOSIS — F17290 Nicotine dependence, other tobacco product, uncomplicated: Secondary | ICD-10-CM | POA: Diagnosis not present

## 2017-08-06 DIAGNOSIS — R21 Rash and other nonspecific skin eruption: Secondary | ICD-10-CM | POA: Insufficient documentation

## 2017-08-06 MED ORDER — TRIAMCINOLONE ACETONIDE 0.1 % EX CREA: 1.0000 "application " | TOPICAL_CREAM | Freq: Two times a day (BID) | CUTANEOUS | 0 refills | Status: DC

## 2017-08-06 MED ORDER — HYDROXYZINE HCL 25 MG PO TABS: 25.0000 mg | ORAL_TABLET | Freq: Four times a day (QID) | ORAL | 0 refills | Status: DC | PRN

## 2017-08-06 NOTE — ED Triage Notes (Addendum)
Pt states that he was "at some girls house" 2 days ago, and she may have had "bed bugs". Raised red areas noted on bilateral arms. Pt states his arms are "very itchy"

## 2017-08-09 NOTE — ED Provider Notes (Signed)
[facility] DEPT Provider Note   CSN: [id number] Arrival date & time: 08/06/17  0808     History   Chief Complaint Chief Complaint  Patient presents with  . Insect Bite    HPI Philip Bowman is a 27 y.o. male.  HPI   27 year old male with rash to bilateral forearms.  Patient states that he first noticed these after waking up at a girl's house that he had just met.Marland Kitchen "It was dark in here place but I could tell it was a mess. That's why I didn't fool around with her."  He has slightly raised red itchy lesions since that time.  No new exposures that he is aware of otherwise.  No fevers or chills.  No arthralgias.  History reviewed. No pertinent past medical history.  There are no active problems to display for this patient.   Past Surgical History:  Procedure Laterality Date  . EYE SURGERY Left         Home Medications    Prior to Admission medications   Medication Sig Start Date End Date Taking? Authorizing Provider  acetaminophen (TYLENOL) 500 MG tablet Take 1,000 mg by mouth every 6 (six) hours as needed for mild pain.   Yes [provider]  hydrOXYzine (ATARAX/VISTARIL) 25 MG tablet Take 1 tablet (25 mg total) by mouth every 6 (six) hours as needed for itching. 08/06/17   Virgel Manifold, MD  triamcinolone cream (KENALOG) 0.1 % Apply 1 application topically 2 (two) times daily. 08/06/17   Virgel Manifold, MD    Family History No family history on file.  Social History Social History   Tobacco Use  . Smoking status: Current Some Day Smoker    Packs/day: 0.50    Types: Cigars  . Smokeless tobacco: Never Used  Substance Use Topics  . Alcohol use: Not Currently  . Drug use: No     Allergies   Patient has no known allergies.   Review of Systems Review of Systems  All systems reviewed and negative, other than as noted in HPI.  Physical Exam Updated Vital Signs BP 122/81 (BP Location: Left Arm)   Pulse (!) 110   Temp  97.8 F (36.6 C) (Oral)   Resp 12   Ht 5' 7"  (1.702 m)   Wt 63.5 kg   SpO2 97%   BMI 21.93 kg/m   Physical Exam  Constitutional: He appears well-developed and well-nourished. No distress.  HENT:  Head: Normocephalic and atraumatic.  Eyes: Conjunctivae are normal. Right eye exhibits no discharge. Left eye exhibits no discharge.  Neck: Neck supple.  Cardiovascular: Normal rate, regular rhythm and normal heart sounds. Exam reveals no gallop and no friction rub.  No murmur heard. Pulmonary/Chest: Effort normal and breath sounds normal. No respiratory distress.  Abdominal: Soft. He exhibits no distension. There is no tenderness.  Musculoskeletal: He exhibits no edema or tenderness.  Neurological: He is alert.  Skin: Skin is warm and dry.  Erythematous papules and nodules to bilateral forearms.  Mildly tender.  No drainage.  Psychiatric: He has a normal mood and affect. His behavior is normal. Thought content normal.  Nursing note and vitals reviewed.    ED Treatments / Results  Labs (all labs ordered are listed, but only abnormal results are displayed) Labs Reviewed - No data to display  EKG None  Radiology No results found.  Procedures Procedures (including critical care time)  Medications Ordered in ED Medications - No data to display  Initial Impression / Assessment and Plan / ED Course  I have reviewed the triage vital signs and the nursing notes.  Pertinent labs & imaging results that were available during my care of the patient were reviewed by me and considered in my medical decision making (see chart for details).     27 year old male with nonspecific rash to bilateral forearms.  May potentially be from bedbugs or other than some bugs.  He has no other symptoms of allergic reaction.  Plan symptomatic treatment.  Return precautions were discussed.  Final Clinical Impressions(s) / ED Diagnoses   Final diagnoses:  Rash and nonspecific skin eruption    ED  Discharge Orders         Ordered    triamcinolone cream (KENALOG) 0.1 %  2 times daily,   Status:  Discontinued     08/06/17 0845    hydrOXYzine (ATARAX/VISTARIL) 25 MG tablet  Every 6 hours PRN,   Status:  Discontinued     08/06/17 0845    hydrOXYzine (ATARAX/VISTARIL) 25 MG tablet  Every 6 hours PRN     08/06/17 0845    triamcinolone cream (KENALOG) 0.1 %  2 times daily     08/06/17 0845           Virgel Manifold, MD 08/09/17 1658

## 2017-08-23 ENCOUNTER — Emergency Department (HOSPITAL_COMMUNITY)
Admission: EM | Admit: 2017-08-23 | Discharge: 2017-08-23 | Disposition: A | Discharge status: Home / Self Care | Payer: Managed Care, Other (non HMO) | Attending: Emergency Medicine | Admitting: Emergency Medicine

## 2017-08-23 ENCOUNTER — Other Ambulatory Visit: Payer: Self-pay

## 2017-08-23 DIAGNOSIS — F17290 Nicotine dependence, other tobacco product, uncomplicated: Secondary | ICD-10-CM | POA: Diagnosis not present

## 2017-08-23 DIAGNOSIS — Z202 Contact with and (suspected) exposure to infections with a predominantly sexual mode of transmission: Secondary | ICD-10-CM | POA: Insufficient documentation

## 2017-08-23 DIAGNOSIS — Z79899 Other long term (current) drug therapy: Secondary | ICD-10-CM | POA: Diagnosis not present

## 2017-08-23 LAB — URINALYSIS, ROUTINE W REFLEX MICROSCOPIC
Bilirubin Urine: NEGATIVE
GLUCOSE, UA: NEGATIVE mg/dL
HGB URINE DIPSTICK: NEGATIVE
Ketones, ur: NEGATIVE mg/dL
Leukocytes, UA: NEGATIVE
Nitrite: NEGATIVE
Protein, ur: NEGATIVE mg/dL
SPECIFIC GRAVITY, URINE: 1.017 (ref 1.005–1.030)
pH: 6 (ref 5.0–8.0)

## 2017-08-23 LAB — GC/CHLAMYDIA PROBE AMP (~~LOC~~) NOT AT ARMC
Chlamydia: POSITIVE — AB
Neisseria Gonorrhea: POSITIVE — AB

## 2017-08-23 MED ORDER — LIDOCAINE HCL (PF) 1 % IJ SOLN
2.0000 mL | Freq: Once | INTRAMUSCULAR | Status: AC
  Administered 2017-08-23: 2 mL
  Filled 2017-08-23: qty 30

## 2017-08-23 MED ORDER — CEFTRIAXONE SODIUM 250 MG IJ SOLR
250.0000 mg | Freq: Once | INTRAMUSCULAR | Status: AC
  Administered 2017-08-23: 250 mg via INTRAMUSCULAR
  Filled 2017-08-23: qty 250

## 2017-08-23 MED ORDER — AZITHROMYCIN 250 MG PO TABS
1000.0000 mg | ORAL_TABLET | Freq: Once | ORAL | Status: AC
  Administered 2017-08-23: 1000 mg via ORAL
  Filled 2017-08-23: qty 4

## 2017-08-23 NOTE — Discharge Instructions (Addendum)
Sustained from sexual contact for 14 days after treatment from STD.  Inform all sexual partners he been treated for STD.  Your gonorrhea, chlamydia, HIV, syphilis testing are pending and will be notified if they are positive.  May have further STD screening at local department.

## 2017-08-23 NOTE — ED Triage Notes (Signed)
Pt states that he had unprotected sex 08/17/17 and is now having burning with urination.

## 2017-08-23 NOTE — ED Provider Notes (Signed)
[facility]-EMERGENCY DEPT Provider Note   CSN: [id number] Arrival date & time: 08/23/17  0426     History   Chief Complaint Chief Complaint  Patient presents with  . Exposure to STD    HPI Philip Bowman is a 27 y.o. male.  HPI 27 year old male presents to the ED for evaluation of STD exposure.  States that last week he had unprotected sex with a male partner.  Since then has been having yellow-green discharge from his penis along with a burning with urination.  Denies any lesions.  Denies any testicular pain or swelling.  Denies abdominal pain, nausea, emesis or fevers.  No known STD exposure but he is concerned given that he has a history of chlamydia and states this feels similar.  Nothing makes symptoms better or worse.  Denies any hematuria, urgency or frequency. No past medical history on file.  There are no active problems to display for this patient.   Past Surgical History:  Procedure Laterality Date  . EYE SURGERY Left         Home Medications    Prior to Admission medications   Medication Sig Start Date End Date Taking? Authorizing Provider  acetaminophen (TYLENOL) 500 MG tablet Take 1,000 mg by mouth every 6 (six) hours as needed for mild pain.   Yes [provider]  hydroxypropyl methylcellulose / hypromellose (ISOPTO TEARS / GONIOVISC) 2.5 % ophthalmic solution Place 1 drop into both eyes 3 (three) times daily as needed for dry eyes.   Yes [provider]  hydrOXYzine (ATARAX/VISTARIL) 25 MG tablet Take 1 tablet (25 mg total) by mouth every 6 (six) hours as needed for itching. 08/06/17  Yes Raeford Razor, MD  triamcinolone cream (KENALOG) 0.1 % Apply 1 application topically 2 (two) times daily. 08/06/17  Yes Raeford Razor, MD    Family History No family history on file.  Social History Social History   Tobacco Use  . Smoking status: Current Some Day Smoker    Packs/day: 0.50    Types: Cigars  . Smokeless  tobacco: Never Used  Substance Use Topics  . Alcohol use: Not Currently  . Drug use: No     Allergies   Patient has no known allergies.   Review of Systems Review of Systems  Gastrointestinal: Negative for abdominal pain, nausea and vomiting.  Genitourinary: Positive for discharge and dysuria. Negative for frequency, genital sores, hematuria, penile pain, penile swelling, scrotal swelling and urgency.  Skin: Negative for rash.     Physical Exam Updated Vital Signs BP 114/75 (BP Location: Right Arm)   Pulse 88   Temp 98.1 F (36.7 C) (Oral)   Resp 18   Ht 5\' 6"  (1.676 m)   Wt 61.2 kg   SpO2 99%   BMI 21.79 kg/m   Physical Exam  Constitutional: He appears well-developed and well-nourished. No distress.  HENT:  Head: Normocephalic and atraumatic.  Eyes: Right eye exhibits no discharge. Left eye exhibits no discharge. No scleral icterus.  Neck: Normal range of motion.  Pulmonary/Chest: No respiratory distress.  Genitourinary:  Genitourinary Comments: Chaperone present for exam. Circumcised male.  Yellow-green discharge noted from the tip of the penis.  No erythema, tenderness, lesion, or rash. 2 descended testes without swelling, pain, lesions or rash. No inguinal lymphadenopathy or hernia.    Musculoskeletal: Normal range of motion.  Neurological: He is alert.  Skin: No pallor.  Psychiatric: His behavior is normal. Judgment and thought content normal.  Nursing note  and vitals reviewed.    ED Treatments / Results  Labs (all labs ordered are listed, but only abnormal results are displayed) Labs Reviewed  RPR  HIV ANTIBODY (ROUTINE TESTING)  URINALYSIS, ROUTINE W REFLEX MICROSCOPIC  GC[facility]    EKG None  Radiology No results found.  Procedures Procedures (including critical care time)  Medications Ordered in ED Medications  cefTRIAXone (ROCEPHIN) injection 250 mg (250 mg Intramuscular Given 08/23/17 0525)    azithromycin (ZITHROMAX) tablet 1,000 mg (1,000 mg Oral Given 08/23/17 0524)  lidocaine (PF) (XYLOCAINE) 1 % injection 2 mL (2 mLs Other Given 08/23/17 0525)     Initial Impression / Assessment and Plan / ED Course  I have reviewed the triage vital signs and the nursing notes.  Pertinent labs & imaging results that were available during my care of the patient were reviewed by me and considered in my medical decision making (see chart for details).     Patient treated in the ED for STI with azithromycin and Rocephin. Patient advised to inform and treat all sexual partners.  Pt advised on safe sex practices and understands that they have GC/Chlamydia cultures pending and will result in 2-3 days. HIV and RPR sent. Pt encouraged to follow up at local health department for future STI checks. No concern for prostatitis or epididymitis. Discussed return precautions. Pt appears safe for discharge.    Final Clinical Impressions(s) / ED Diagnoses   Final diagnoses:  STD exposure    ED Discharge Orders    None       Wallace KellerLeaphart, Rella Egelston T, PA-C 08/23/17 0555    Molpus, Jonny RuizJohn, MD 08/23/17 [phone]

## 2017-08-24 LAB — HIV ANTIBODY (ROUTINE TESTING W REFLEX): HIV SCREEN 4TH GENERATION: NONREACTIVE

## 2017-08-24 LAB — RPR: RPR: NONREACTIVE

## 2018-02-27 IMAGING — CR DG SHOULDER 2+V*L*
3 series · 3 of 3 positions shown · non-contrast
Comparison: None.

CLINICAL DATA: Transient shoulder dislocation [DATE],
persistent pain.

EXAM:
LEFT SHOULDER - 2+ VIEW

[w shoulder external left]
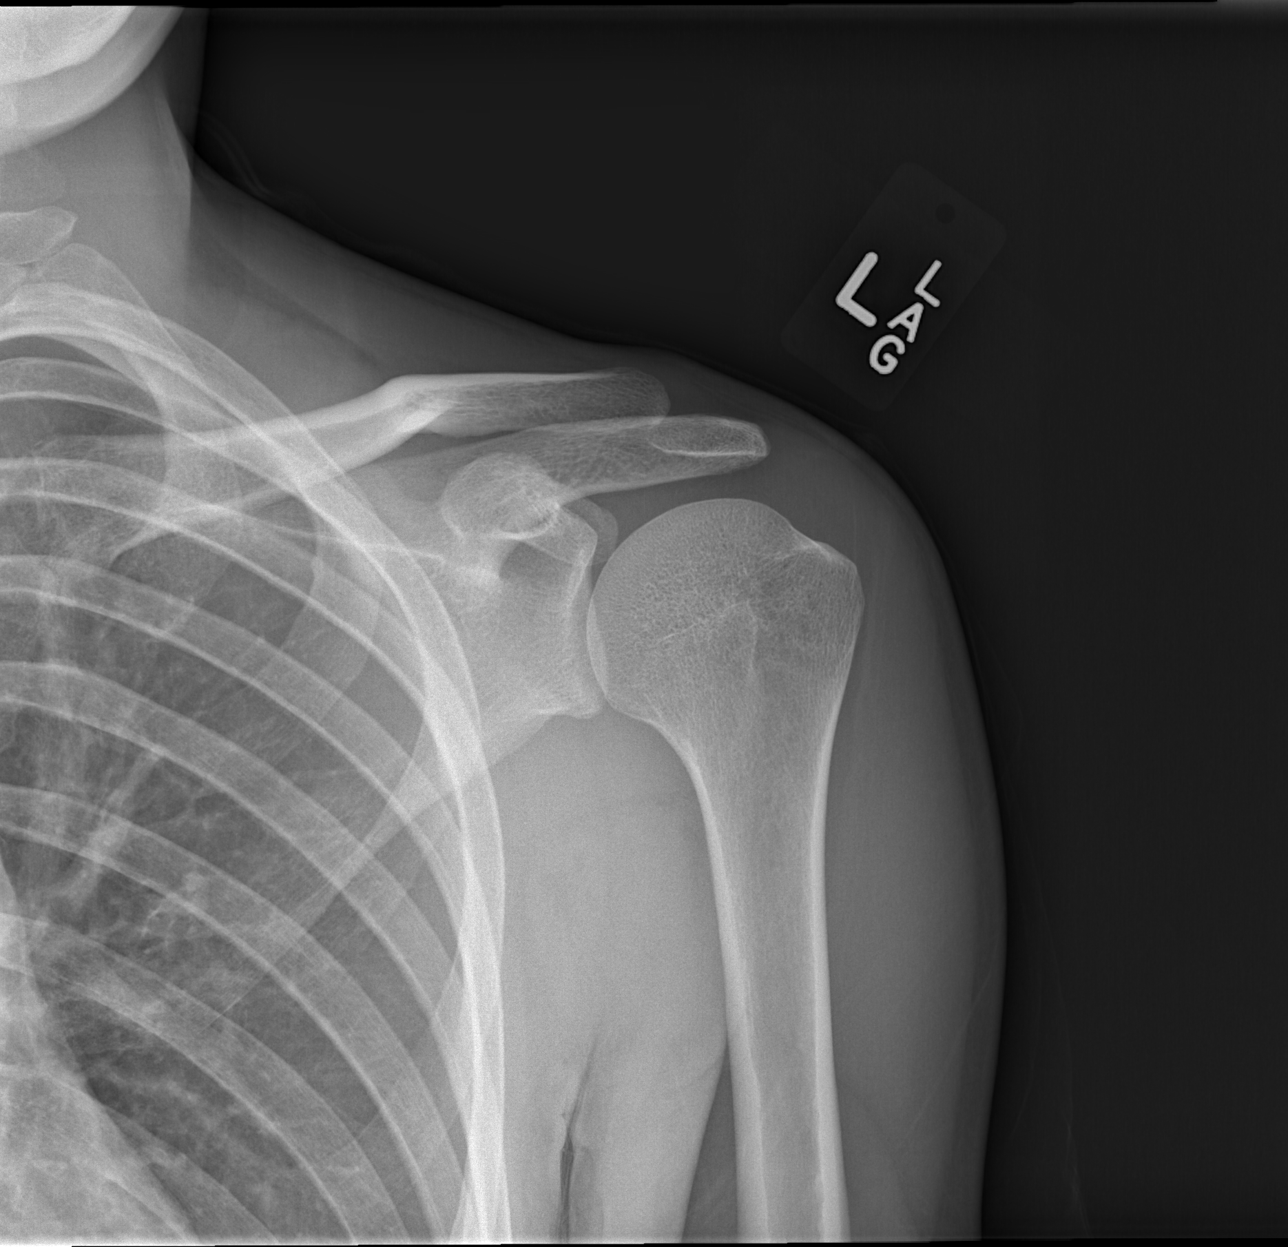

[w shoulder y-view left]
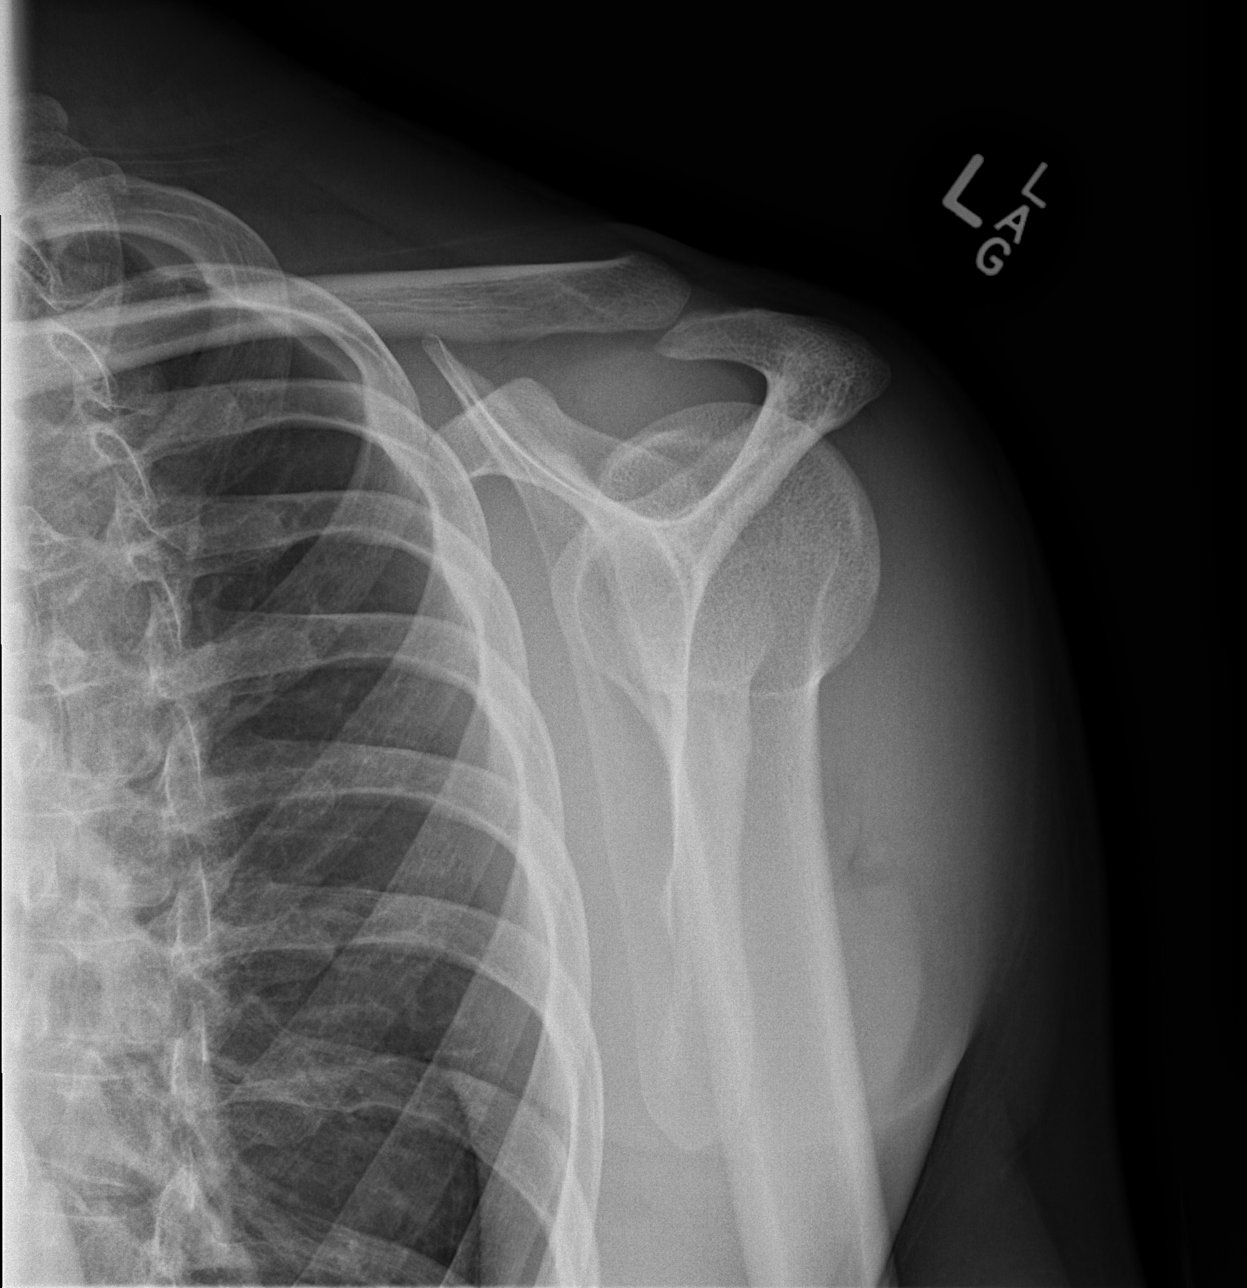

[x shoulder axillary left]
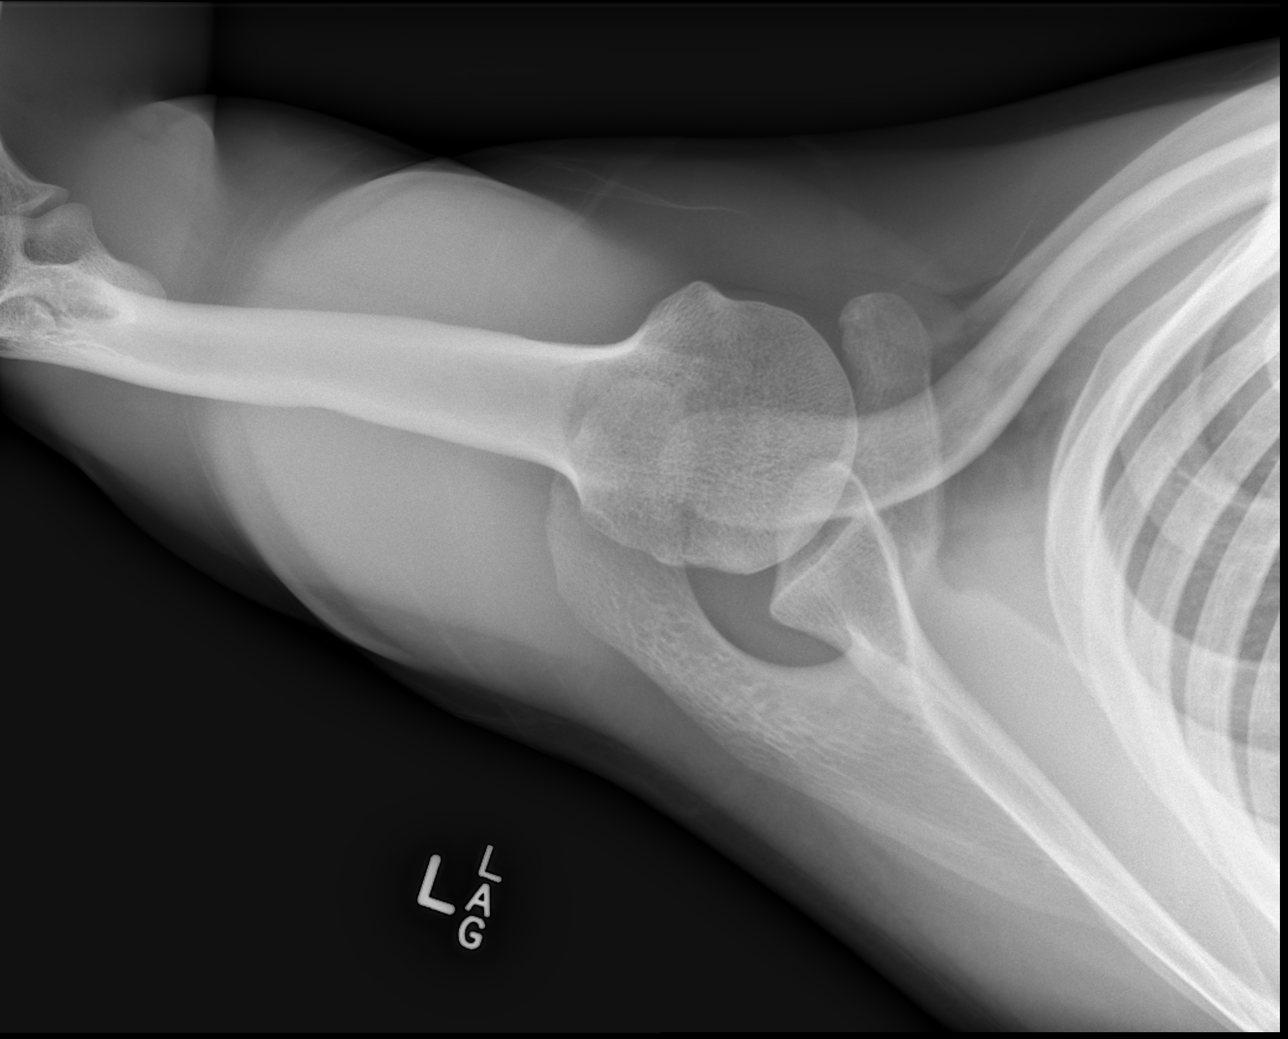

[3 of 3 positions shown; findings below may reference images not displayed]

FINDINGS: The humeral head is well-formed and located. The subacromial,
glenohumeral and acromioclavicular joint spaces are intact. No
destructive bony lesions. Soft tissue planes are non-suspicious.
IMPRESSION: Negative.

## 2018-04-15 ENCOUNTER — Encounter (HOSPITAL_COMMUNITY): Payer: Self-pay | Admitting: Emergency Medicine

## 2018-04-15 ENCOUNTER — Other Ambulatory Visit: Payer: Self-pay

## 2018-04-15 DIAGNOSIS — F17290 Nicotine dependence, other tobacco product, uncomplicated: Secondary | ICD-10-CM | POA: Diagnosis not present

## 2018-04-15 DIAGNOSIS — R197 Diarrhea, unspecified: Secondary | ICD-10-CM | POA: Insufficient documentation

## 2018-04-15 DIAGNOSIS — R1033 Periumbilical pain: Secondary | ICD-10-CM | POA: Diagnosis present

## 2018-04-15 MED ORDER — SODIUM CHLORIDE 0.9% FLUSH
3.0000 mL | Freq: Once | INTRAVENOUS | Status: AC
  Administered 2018-04-16: 3 mL via INTRAVENOUS

## 2018-04-15 NOTE — ED Notes (Signed)
Pt refused blood draw for labs Pt states, "I just don't like the blood and shit."

## 2018-04-15 NOTE — ED Triage Notes (Signed)
Patient reports eating seafood today around 1500. While at work this evening the patient had abdominal pain and diarrhea.

## 2018-04-16 ENCOUNTER — Emergency Department (HOSPITAL_COMMUNITY)
Admission: EM | Admit: 2018-04-16 | Discharge: 2018-04-16 | Disposition: A | Discharge status: Home / Self Care | Payer: Managed Care, Other (non HMO) | Attending: Emergency Medicine | Admitting: Emergency Medicine

## 2018-04-16 DIAGNOSIS — R1033 Periumbilical pain: Secondary | ICD-10-CM

## 2018-04-16 DIAGNOSIS — R197 Diarrhea, unspecified: Secondary | ICD-10-CM

## 2018-04-16 LAB — COMPREHENSIVE METABOLIC PANEL
ALT: 32 U/L (ref 0–44)
AST: 26 U/L (ref 15–41)
Albumin: 5.4 g/dL — ABNORMAL HIGH (ref 3.5–5.0)
Alkaline Phosphatase: 114 U/L (ref 38–126)
Anion gap: 9 (ref 5–15)
BUN: 14 mg/dL (ref 6–20)
CO2: 25 mmol/L (ref 22–32)
Calcium: 10.3 mg/dL (ref 8.9–10.3)
Chloride: 105 mmol/L (ref 98–111)
Creatinine, Ser: 0.84 mg/dL (ref 0.61–1.24)
GFR calc Af Amer: >60 mL/min (ref >60)
GFR calc non Af Amer: >60 mL/min (ref >60)
Glucose, Bld: 86 mg/dL (ref 70–99)
Potassium: 3.7 mmol/L (ref 3.5–5.1)
Sodium: 139 mmol/L (ref 135–145)
Total Bilirubin: 1.1 mg/dL (ref 0.3–1.2)
Total Protein: 8.8 g/dL — ABNORMAL HIGH (ref 6.5–8.1)

## 2018-04-16 LAB — URINALYSIS, ROUTINE W REFLEX MICROSCOPIC
Bilirubin Urine: NEGATIVE
Glucose, UA: NEGATIVE mg/dL
Hgb urine dipstick: NEGATIVE
Ketones, ur: NEGATIVE mg/dL
Leukocytes,Ua: NEGATIVE
Nitrite: NEGATIVE
Protein, ur: NEGATIVE mg/dL
Specific Gravity, Urine: 1.02 (ref 1.005–1.030)
pH: 6 (ref 5.0–8.0)

## 2018-04-16 LAB — CBC WITH DIFFERENTIAL/PLATELET
Abs Immature Granulocytes: 0.02 10*3/uL (ref 0.00–0.07)
Basophils Absolute: 0 10*3/uL (ref 0.0–0.1)
Basophils Relative: 0 %
Eosinophils Absolute: 0.1 10*3/uL (ref 0.0–0.5)
Eosinophils Relative: 1 %
HCT: 46.5 % (ref 39.0–52.0)
Hemoglobin: 15.8 g/dL (ref 13.0–17.0)
Immature Granulocytes: 0 %
Lymphocytes Relative: 45 %
Lymphs Abs: 3.6 10*3/uL (ref 0.7–4.0)
MCH: 32.2 pg (ref 26.0–34.0)
MCHC: 34 g/dL (ref 30.0–36.0)
MCV: 94.7 fL (ref 80.0–100.0)
Monocytes Absolute: 0.6 10*3/uL (ref 0.1–1.0)
Monocytes Relative: 7 %
Neutro Abs: 3.8 10*3/uL (ref 1.7–7.7)
Neutrophils Relative %: 47 %
Platelets: 285 10*3/uL (ref 150–400)
RBC: 4.91 MIL/uL (ref 4.22–5.81)
RDW: 12 % (ref 11.5–15.5)
WBC: 8.1 10*3/uL (ref 4.0–10.5)
nRBC: 0 % (ref 0.0–0.2)

## 2018-04-16 LAB — LIPASE, BLOOD: Lipase: 26 U/L (ref 11–51)

## 2018-04-16 MED ORDER — SODIUM CHLORIDE 0.9 % IV BOLUS
1000.0000 mL | Freq: Once | INTRAVENOUS | Status: AC
  Administered 2018-04-16: 1000 mL via INTRAVENOUS

## 2018-04-16 NOTE — ED Provider Notes (Signed)
[facility]-EMERGENCY DEPT Provider Note   CSN: [id number] Arrival date & time: 04/15/18  2241    History   Chief Complaint Chief Complaint  Patient presents with  . Abdominal Pain    HPI Philip Bowman is a 28 y.o. male.  The history is provided by the patient.  Abdominal Pain  He had onset this evening of crampy periumbilical pain and diarrhea.  He had 2 watery bowel movements.  Pain did improve following bowel movement.  He continues to have pain which he rates at 5/10.  He does not feel like he is going to have any more diarrhea.  He denies nausea or vomiting.  He denies fever, chills, sweats.  There is no radiation of pain.  He has not taken anything for pain.  He thinks it might be related to having eaten some fish.  He denies any sick contacts.  He denies recent travel denies exposure to anyone with COVID-19.  History reviewed. No pertinent past medical history.  There are no active problems to display for this patient.   Past Surgical History:  Procedure Laterality Date  . EYE SURGERY Left         Home Medications    Prior to Admission medications   Medication Sig Start Date End Date Taking? Authorizing Provider  acetaminophen (TYLENOL) 500 MG tablet Take 1,000 mg by mouth every 6 (six) hours as needed for mild pain.    [provider]  hydroxypropyl methylcellulose / hypromellose (ISOPTO TEARS / GONIOVISC) 2.5 % ophthalmic solution Place 1 drop into both eyes 3 (three) times daily as needed for dry eyes.    [provider]  hydrOXYzine (ATARAX/VISTARIL) 25 MG tablet Take 1 tablet (25 mg total) by mouth every 6 (six) hours as needed for itching. 08/06/17   Raeford RazorKohut, Stephen, MD  triamcinolone cream (KENALOG) 0.1 % Apply 1 application topically 2 (two) times daily. 08/06/17   Raeford RazorKohut, Stephen, MD    Family History History reviewed. No pertinent family history.  Social History Social History   Tobacco Use  . Smoking status:  Current Some Day Smoker    Packs/day: 0.50    Types: Cigars  . Smokeless tobacco: Never Used  Substance Use Topics  . Alcohol use: Not Currently  . Drug use: No     Allergies   Patient has no known allergies.   Review of Systems Review of Systems  Gastrointestinal: Positive for abdominal pain.  All other systems reviewed and are negative.    Physical Exam Updated Vital Signs BP 134/75 (BP Location: Left Arm)   Pulse 77   Temp 98.1 F (36.7 C) (Oral)   Resp 20   Ht 5\' 6"  (1.676 m)   Wt 74.8 kg   SpO2 100%   BMI 26.63 kg/m   Physical Exam Vitals signs and nursing note reviewed.    28 year old male, resting comfortably and in no acute distress. Vital signs are normal. Oxygen saturation is 100%, which is normal. Head is normocephalic and atraumatic. PERRLA, EOMI. Oropharynx is clear. Neck is nontender and supple without adenopathy or JVD. Back is nontender and there is no CVA tenderness. Lungs are clear without rales, wheezes, or rhonchi. Chest is nontender. Heart has regular rate and rhythm without murmur. Abdomen is soft, flat, nontender without masses or hepatosplenomegaly and peristalsis is slightly hypoactive. Extremities have no cyanosis or edema, full range of motion is present. Skin is warm and dry without rash. Neurologic: Mental status is normal,  cranial nerves are intact, there are no motor or sensory deficits.  ED Treatments / Results  Labs (all labs ordered are listed, but only abnormal results are displayed) Labs Reviewed  COMPREHENSIVE METABOLIC PANEL - Abnormal; Notable for the following components:      Result Value   Total Protein 8.8 (*)    Albumin 5.4 (*)    All other components within normal limits  LIPASE, BLOOD  URINALYSIS, ROUTINE W REFLEX MICROSCOPIC  CBC WITH DIFFERENTIAL/PLATELET   Procedures Procedures  Medications Ordered in ED Medications  sodium chloride flush (NS) 0.9 % injection 3 mL (3 mLs Intravenous Given 04/16/18  0205)  sodium chloride 0.9 % bolus 1,000 mL (0 mLs Intravenous Stopped 04/16/18 0317)     Initial Impression / Assessment and Plan / ED Course  I have reviewed the triage vital signs and the nursing notes.  Pertinent lab results that were available during my care of the patient were reviewed by me and considered in my medical decision making (see chart for details).  Abdominal pain and diarrhea, most likely viral enteritis.  Consider food poisoning.  Doubt serious pathology such as diverticulitis.  Exam is benign.  Will check screening labs and give IV fluids.  Old records are reviewed, and he has 3 prior ED visits for diarrhea.  Repeated episodes also brings up possibility of irritable bowel syndrome as a cause.  Labs are unremarkable.  He is feeling better after above-noted treatment.  He is discharged with instructions to use loperamide as needed if diarrhea recurs.  Final Clinical Impressions(s) / ED Diagnoses   Final diagnoses:  Diarrhea, unspecified type  Periumbilical abdominal pain    ED Discharge Orders    None       Dione Booze, MD 04/16/18 0400

## 2018-04-16 NOTE — ED Notes (Signed)
Bed: WA22 Expected date:  Expected time:  Means of arrival:  Comments: 

## 2018-04-16 NOTE — Discharge Instructions (Signed)
Take loperamide (Imodium A-D) as needed for diarrhea. ?

## 2018-05-13 ENCOUNTER — Other Ambulatory Visit: Payer: Self-pay

## 2018-05-13 DIAGNOSIS — Z79899 Other long term (current) drug therapy: Secondary | ICD-10-CM | POA: Insufficient documentation

## 2018-05-13 DIAGNOSIS — M7918 Myalgia, other site: Secondary | ICD-10-CM | POA: Diagnosis not present

## 2018-05-13 DIAGNOSIS — M79621 Pain in right upper arm: Secondary | ICD-10-CM | POA: Diagnosis present

## 2018-05-13 DIAGNOSIS — F17210 Nicotine dependence, cigarettes, uncomplicated: Secondary | ICD-10-CM | POA: Insufficient documentation

## 2018-05-14 ENCOUNTER — Encounter (HOSPITAL_COMMUNITY): Payer: Self-pay

## 2018-05-14 ENCOUNTER — Emergency Department (HOSPITAL_COMMUNITY)
Admission: EM | Admit: 2018-05-14 | Discharge: 2018-05-14 | Disposition: A | Discharge status: Home / Self Care | Payer: Managed Care, Other (non HMO) | Attending: Emergency Medicine | Admitting: Emergency Medicine

## 2018-05-14 ENCOUNTER — Other Ambulatory Visit: Payer: Self-pay

## 2018-05-14 DIAGNOSIS — M7918 Myalgia, other site: Secondary | ICD-10-CM

## 2018-05-14 MED ORDER — IBUPROFEN 600 MG PO TABS: 600.0000 mg | ORAL_TABLET | Freq: Four times a day (QID) | ORAL | 0 refills | Status: AC | PRN

## 2018-05-14 NOTE — ED Triage Notes (Signed)
Pt reports pain in his R upper arm that started after helping a friend move. He states that he needs documentation of the injury because he is a Nature conservation officer at [employer]. No obvious deformity or distal numbness or tingling. Full ROM noted.

## 2018-05-14 NOTE — ED Provider Notes (Signed)
[facility]-EMERGENCY DEPT Provider Note   CSN: [id number] Arrival date & time: 05/13/18  2358    History   Chief Complaint Chief Complaint  Patient presents with  . Arm Pain    HPI Philip Bowman is a 28 y.o. male.     Patient to ED with c/o right upper arm pain he feels started while helping a friend move some heavy furniture. Worse with movement. No discoloration or swelling. 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   The history is provided by the patient. No language interpreter was used.    History reviewed. No pertinent past medical history.  There are no active problems to display for this patient.   Past Surgical History:  Procedure Laterality Date  . EYE SURGERY Left         Home Medications    Prior to Admission medications   Medication Sig Start Date End Date Taking? Authorizing Provider  acetaminophen (TYLENOL) 500 MG tablet Take 1,000 mg by mouth every 6 (six) hours as needed for mild pain.    [provider]    Family History History reviewed. No pertinent family history.  Social History Social History   Tobacco Use  . Smoking status: Current Some Day Smoker    Packs/day: 0.50    Types: Cigars  . Smokeless tobacco: Never Used  Substance Use Topics  . Alcohol use: Not Currently  . Drug use: No     Allergies   Patient has no known allergies.   Review of Systems Review of Systems  Musculoskeletal:       See HPI.  Skin: Negative for color change.  Neurological: Negative for weakness and numbness.     Physical Exam Updated Vital Signs BP 125/66 (BP Location: Left Arm)   Pulse 80   Temp 97.9 F (36.6 C) (Oral)   Resp 16   Ht 5\' 6"  (1.676 m)   Wt 70.3 kg   SpO2 97%   BMI 25.02 kg/m   Physical Exam Constitutional:      Appearance: He is  well-developed.  Neck:     Musculoskeletal: Normal range of motion.  Cardiovascular:     Pulses: Normal pulses.  Pulmonary:     Effort: Pulmonary effort is normal.  Musculoskeletal: Normal range of motion.     Comments: Right upper arm is unremarkable in appearance. Minimally tender to bicep. No discoloration. Full strength on biceps flexion.   Skin:    General: Skin is warm and dry.  Neurological:     Mental Status: He is alert and oriented to person, place, and time.      ED Treatments / Results  Labs (all labs ordered are listed, but only abnormal results are displayed) Labs Reviewed - No data to display  EKG None  Radiology No results found.  Procedures Procedures (including critical care time)  Medications Ordered in ED Medications - No data to display   Initial Impression / Assessment and Plan / ED Course  I have reviewed the triage vital signs and the nursing notes.  Pertinent labs & imaging results that were available during my care of the patient were reviewed by me and considered in my medical decision making (see chart for details).        Patient to ED with muscular soreness of right upper arm  after heavy lifting. Do not suspect rupture, hematoma, fracture.   He is given a 24 hour note for work. Recommend ibuprofen.   Final Clinical Impressions(s) / ED Diagnoses   Final diagnoses:  None   1. Musculoskeletal pain  ED Discharge Orders    None       Elpidio AnisUpstill, Maribel Luis, PA-C 05/14/18 0325    Ward, Layla MawKristen N, DO 05/14/18 [phone]

## 2018-10-31 DIAGNOSIS — Z79899 Other long term (current) drug therapy: Secondary | ICD-10-CM | POA: Insufficient documentation

## 2018-10-31 DIAGNOSIS — Z87891 Personal history of nicotine dependence: Secondary | ICD-10-CM | POA: Diagnosis not present

## 2018-10-31 DIAGNOSIS — M79641 Pain in right hand: Secondary | ICD-10-CM | POA: Insufficient documentation

## 2018-11-01 ENCOUNTER — Emergency Department (HOSPITAL_COMMUNITY)
Admission: EM | Admit: 2018-11-01 | Discharge: 2018-11-01 | Disposition: A | Discharge status: Home / Self Care | Payer: Managed Care, Other (non HMO) | Attending: Emergency Medicine | Admitting: Emergency Medicine

## 2018-11-01 ENCOUNTER — Other Ambulatory Visit: Payer: Self-pay

## 2018-11-01 ENCOUNTER — Encounter (HOSPITAL_COMMUNITY): Payer: Self-pay

## 2018-11-01 DIAGNOSIS — M79641 Pain in right hand: Secondary | ICD-10-CM

## 2018-11-01 NOTE — Discharge Instructions (Signed)
Try motrin 600 mg 4 times a day for pain. Use ice at the end of your shift. Recheck by Dr Aline Brochure, the orthopedist on call, if not improving in 7-10 days.

## 2018-11-01 NOTE — ED Provider Notes (Signed)
[facility] EMERGENCY DEPARTMENT Provider Note   CSN: [id number] Arrival date & time: 10/31/18  2352   Time seen 01:24 AM  History   Chief Complaint Chief Complaint  Patient presents with  . Hand Pain    HPI Philip Bowman is a 28 y.o. male.     HPI patient states he started a new position at work about 2 weeks ago.  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  He loads semitruck loads of them and sometimes has to push them around to get them in position.  He states that a second week he started doing the new job he started having some pain in his right hand.  He states his hand is sore.  He states he had some numbness in his hand tonight about 6:30 PM although he did not work during the day.  He states he mainly had pain around his right index and right middle fingers that lasted about 30 minutes.  He states if he has a few days off from work he does not have discomfort.  He took Tylenol 2 hours ago which must of help because his discomfort is gone now.  He states he normally works from 7 PM to 9 AM at [employer] in [place].  PCP Patient, No Pcp Per   History reviewed. No pertinent past medical history.  There are no active problems to display for this patient.   Past Surgical History:  Procedure Laterality Date  . EYE SURGERY Left         Home Medications    Prior to Admission medications   Medication Sig Start Date End Date Taking? Authorizing Provider  acetaminophen (TYLENOL) 500 MG tablet Take 1,000 mg by mouth every 6 (six) hours as needed for mild pain.    [provider]  ibuprofen (ADVIL) 600 MG tablet Take 1 tablet (600 mg total) by mouth every 6 (six) hours as needed. 05/14/18   Charlann Lange, PA-C    Family History No family history on file.  Social History Social History   Tobacco Use  . Smoking status: Former Smoker    Packs/day: 0.50    Types: Cigars  .  Smokeless tobacco: Never Used  Substance Use Topics  . Alcohol use: Never    Frequency: Never  . Drug use: No  employed   Allergies   Patient has no known allergies.   Review of Systems Review of Systems  All other systems reviewed and are negative.    Physical Exam Updated Vital Signs BP (!) 100/58   Pulse 72   Temp 97.7 F (36.5 C) (Oral)   Resp 15   Ht 5\' 6"  (1.676 m)   Wt 68 kg   SpO2 99%   BMI 24.21 kg/m   Physical Exam Vitals signs and nursing note reviewed.  Constitutional:      Appearance: Normal appearance.  HENT:     Head: Normocephalic and atraumatic.     Right Ear: External ear normal.     Left Ear: External ear normal.  Eyes:     Extraocular Movements: Extraocular movements intact.     Conjunctiva/sclera: Conjunctivae normal.  Neck:     Musculoskeletal: Normal range of motion.  Cardiovascular:     Rate and Rhythm: Normal rate.  Pulmonary:     Effort: Pulmonary effort is normal. No respiratory distress.  Musculoskeletal: Normal range of motion.  General: Tenderness present.     Comments: Patient has good range of motion of his elbow, wrist, and fingers of his right hand.  He is able to dorsiflex his wrist and do supination and pronation without discomfort.  When I test him for De Quervains tenosynovitis it is positive. But he states that isn't where he feels the pain.   Skin:    General: Skin is warm and dry.     Findings: No bruising, erythema, lesion or rash.  Neurological:     General: No focal deficit present.     Mental Status: He is alert and oriented to person, place, and time.     Cranial Nerves: No cranial nerve deficit.  Psychiatric:        Mood and Affect: Mood normal.        Behavior: Behavior normal.        Thought Content: Thought content normal.      ED Treatments / Results  Labs (all labs ordered are listed, but only abnormal results are displayed) Labs Reviewed - No data to display  EKG None  Radiology No  results found.  Procedures Procedures (including critical care time)  Medications Ordered in ED Medications - No data to display   Initial Impression / Assessment and Plan / ED Course  I have reviewed the triage vital signs and the nursing notes.  Pertinent labs & imaging results that were available during my care of the patient were reviewed by me and considered in my medical decision making (see chart for details).        Pt is currently pain free  Final Clinical Impressions(s) / ED Diagnoses   Final diagnoses:  Pain of right hand    ED Discharge Orders    None    OTC ibuprofen and acetaminophen  Plan discharge  Devoria Albe, MD, Concha Pyo, MD 11/01/18 [phone]

## 2018-11-01 NOTE — ED Triage Notes (Signed)
Pt states he just started a new job, using his hands a lot, states he awoke 6pm tonight with his right hand tingling, states is worse while working.  Pt denies injury

## 2018-11-08 ENCOUNTER — Emergency Department (HOSPITAL_COMMUNITY)
Admission: EM | Admit: 2018-11-08 | Discharge: 2018-11-08 | Disposition: A | Discharge status: Home / Self Care | Payer: Managed Care, Other (non HMO) | Attending: Emergency Medicine | Admitting: Emergency Medicine

## 2018-11-08 ENCOUNTER — Encounter (HOSPITAL_COMMUNITY): Payer: Self-pay | Admitting: Emergency Medicine

## 2018-11-08 ENCOUNTER — Emergency Department (HOSPITAL_COMMUNITY): Payer: Managed Care, Other (non HMO)

## 2018-11-08 ENCOUNTER — Other Ambulatory Visit: Payer: Self-pay

## 2018-11-08 DIAGNOSIS — S62639A Displaced fracture of distal phalanx of unspecified finger, initial encounter for closed fracture: Secondary | ICD-10-CM

## 2018-11-08 DIAGNOSIS — W230XXA Caught, crushed, jammed, or pinched between moving objects, initial encounter: Secondary | ICD-10-CM | POA: Insufficient documentation

## 2018-11-08 DIAGNOSIS — Z87891 Personal history of nicotine dependence: Secondary | ICD-10-CM | POA: Insufficient documentation

## 2018-11-08 DIAGNOSIS — S62632A Displaced fracture of distal phalanx of right middle finger, initial encounter for closed fracture: Secondary | ICD-10-CM | POA: Insufficient documentation

## 2018-11-08 DIAGNOSIS — Z23 Encounter for immunization: Secondary | ICD-10-CM | POA: Diagnosis not present

## 2018-11-08 DIAGNOSIS — Y999 Unspecified external cause status: Secondary | ICD-10-CM | POA: Insufficient documentation

## 2018-11-08 DIAGNOSIS — S61212A Laceration without foreign body of right middle finger without damage to nail, initial encounter: Secondary | ICD-10-CM

## 2018-11-08 DIAGNOSIS — Y9389 Activity, other specified: Secondary | ICD-10-CM | POA: Insufficient documentation

## 2018-11-08 DIAGNOSIS — Y92524 Gas station as the place of occurrence of the external cause: Secondary | ICD-10-CM | POA: Insufficient documentation

## 2018-11-08 MED ORDER — LIDOCAINE HCL 2 % IJ SOLN
5.0000 mL | Freq: Once | INTRAMUSCULAR | Status: DC
  Filled 2018-11-08: qty 20

## 2018-11-08 MED ORDER — CEPHALEXIN 500 MG PO CAPS: 500.0000 mg | ORAL_CAPSULE | Freq: Four times a day (QID) | ORAL | 0 refills | Status: AC

## 2018-11-08 MED ORDER — TETANUS-DIPHTH-ACELL PERTUSSIS 5-2.5-18.5 LF-MCG/0.5 IM SUSP
0.5000 mL | Freq: Once | INTRAMUSCULAR | Status: AC
  Administered 2018-11-08: 0.5 mL via INTRAMUSCULAR
  Filled 2018-11-08: qty 0.5

## 2018-11-08 MED ORDER — CEPHALEXIN 500 MG PO CAPS
500.0000 mg | ORAL_CAPSULE | Freq: Once | ORAL | Status: AC
  Administered 2018-11-08: 500 mg via ORAL
  Filled 2018-11-08: qty 1

## 2018-11-08 NOTE — Discharge Instructions (Addendum)
Your stitches will dissolve and do not need to be removed.  We recommend that you keep the area covered with a dressing to prevent infection.  Change this dressing at least once per day to keep the area clean.  You may use mild soap and warm water daily.  Take Keflex as prescribed to prevent infection.  Wear a finger splint for protection.  Do not soak your hand in water such as while taking a bath or washing dishes. Take 600mg  ibuprofen every 6 hours for pain. You may return for new or concerning symptoms.

## 2018-11-08 NOTE — ED Provider Notes (Signed)
[facility] DEPT Provider Note   CSN: [id number] Arrival date & time: 11/08/18  0142     History   Chief Complaint Chief Complaint  Patient presents with  . Hand Pain    HPI Philip Bowman is a 28 y.o. male.     28 year old male presents to the emergency department for evaluation of pain to his right third finger.  He states that he was pumping gas and was getting back in his car when he accidentally closed the door on his right hand.  Notes wound to his right third finger with bleeding controlled with pressure prior to arrival.  No medications taken.  No limited range of motion.  Patient cannot recall the date of his last tetanus shot.  The history is provided by the patient. No language interpreter was used.  Hand Pain    History reviewed. No pertinent past medical history.  There are no active problems to display for this patient.   Past Surgical History:  Procedure Laterality Date  . EYE SURGERY Left         Home Medications    Prior to Admission medications   Medication Sig Start Date End Date Taking? Authorizing Provider  acetaminophen (TYLENOL) 500 MG tablet Take 1,000 mg by mouth every 6 (six) hours as needed for mild pain.   Yes [provider]  cephALEXin (KEFLEX) 500 MG capsule Take 1 capsule (500 mg total) by mouth 4 (four) times daily. 11/08/18   Antonietta Breach, PA-C  ibuprofen (ADVIL) 600 MG tablet Take 1 tablet (600 mg total) by mouth every 6 (six) hours as needed. Patient not taking: Reported on 11/08/2018 05/14/18   Charlann Lange, PA-C    Family History History reviewed. No pertinent family history.  Social History Social History   Tobacco Use  . Smoking status: Former Smoker    Packs/day: 0.50    Types: Cigars  . Smokeless tobacco: Never Used  Substance Use Topics  . Alcohol use: Never    Frequency: Never  . Drug use: No     Allergies   Patient has no known allergies.   Review of Systems  Review of Systems Ten systems reviewed and are negative for acute change, except as noted in the HPI.    Physical Exam Updated Vital Signs BP 125/73 (BP Location: Left Arm)   Pulse 91   Temp 98 F (36.7 C) (Oral)   Resp 20   Ht 5\' 6"  (1.676 m)   Wt 68 kg   SpO2 99%   BMI 24.21 kg/m   Physical Exam Vitals signs and nursing note reviewed.  Constitutional:      General: He is not in acute distress.    Appearance: He is well-developed. He is not diaphoretic.     Comments: Nontoxic appearing and in NAD  HENT:     Head: Normocephalic and atraumatic.  Eyes:     General: No scleral icterus.    Conjunctiva/sclera: Conjunctivae normal.  Neck:     Musculoskeletal: Normal range of motion.  Cardiovascular:     Rate and Rhythm: Normal rate and regular rhythm.     Pulses: Normal pulses.     Comments: Capillary refill brisk to all digits of the right hand.  Distal radial pulse 2+ in the right upper extremity. Pulmonary:     Effort: Pulmonary effort is normal. No respiratory distress.     Comments: Respirations even and unlabored Musculoskeletal: Normal range of motion.     Right  hand: He exhibits tenderness and laceration. He exhibits normal range of motion, no bony tenderness and normal capillary refill. Normal sensation noted. Normal strength noted.       Hands:  Skin:    General: Skin is warm and dry.     Coloration: Skin is not pale.     Findings: No erythema or rash.     Comments: 2 small lacerations to the fat pad of the right 3rd finger. Scant exposed subcutaneous fat. No palpable, pulsatile bleeding. No foreign bodies.  Neurological:     General: No focal deficit present.     Mental Status: He is alert and oriented to person, place, and time.     Coordination: Coordination normal.  Psychiatric:        Behavior: Behavior normal.      ED Treatments / Results  Labs (all labs ordered are listed, but only abnormal results are displayed) Labs Reviewed - No data to  display  EKG None  Radiology Dg Hand Complete Right  Result Date: 11/08/2018 CLINICAL DATA:  In initial evaluation for acute trauma, injury to distal aspect of index and ring finger. EXAM: RIGHT HAND - COMPLETE 3+ VIEW COMPARISON:  None. FINDINGS: There is an acute mildly displaced fracture through the distal tuft of the right third digit, best seen on lateral view. Overlying soft tissue swelling and injury. No other acute fracture dislocation. Osseous mineralization normal. Soft tissues otherwise unremarkable. IMPRESSION: 1. Acute mildly displaced fracture through the distal tuft of the right third digit with overlying soft tissue swelling/injury. 2. No other acute fracture or dislocation. Electronically Signed   By: Rise Mu M.D.   On: 11/08/2018 02:08    Procedures Procedures (including critical care time)  LACERATION REPAIR Performed by: Antony Madura Authorized by: Antony Madura Consent: Verbal consent obtained. Risks and benefits: risks, benefits and alternatives were discussed Consent given by: patient Patient identity confirmed: provided demographic data Prepped and Draped in normal sterile fashion Wound explored  Laceration Location: R middle finger  Laceration Length: 0.5cm  No Foreign Bodies seen or palpated  Anesthesia: digital block  Local anesthetic: lidocaine 2% without epinephrine  Anesthetic total: 5 ml  Irrigation method: syringe Amount of cleaning: standard  Skin closure: 5-0 chromic  Number of sutures: 1  Technique: running  Patient tolerance: Patient tolerated the procedure well with no immediate complications.  LACERATION REPAIR Performed by: Antony Madura Authorized by: Antony Madura Consent: Verbal consent obtained. Risks and benefits: risks, benefits and alternatives were discussed Consent given by: patient Patient identity confirmed: provided demographic data Prepped and Draped in normal sterile fashion Wound explored  Laceration  Location: R middle finger  Laceration Length: 0.5cm  No Foreign Bodies seen or palpated  Anesthesia: digital block  Local anesthetic: lidocaine 2% without epinephrine  Anesthetic total: 5 ml  Irrigation method: syringe Amount of cleaning: standard  Skin closure: 5-0 chromic  Number of sutures: 3  Technique: simple interrupted  Patient tolerance: Patient tolerated the procedure well with no immediate complications.   Medications Ordered in ED Medications  lidocaine (XYLOCAINE) 2 % (with pres) injection 100 mg (has no administration in time range)  cephALEXin (KEFLEX) capsule 500 mg (has no administration in time range)  Tdap (BOOSTRIX) injection 0.5 mL (0.5 mLs Intramuscular Given 11/08/18 0352)     Initial Impression / Assessment and Plan / ED Course  I have reviewed the triage vital signs and the nursing notes.  Pertinent labs & imaging results that were available during my care  of the patient were reviewed by me and considered in my medical decision making (see chart for details).        Tdap booster given. Laceration occurred < 8 hours prior to repair which was well tolerated. Patient has no comorbidities to effect normal wound healing. Discussed suture home care with pt and answered questions. Patient to follow up for wound check PRN. Discharged on course of Keflex given small tuft fracture and dirty wound. Return precautions discussed and provided. Patient discharged in stable condition with no unaddressed concerns.   Final Clinical Impressions(s) / ED Diagnoses   Final diagnoses:  Laceration of right middle finger without foreign body without damage to nail, initial encounter  Closed fracture of tuft of distal phalanx of finger    ED Discharge Orders         Ordered    cephALEXin (KEFLEX) 500 MG capsule  4 times daily     11/08/18 0452           Antony MaduraHumes, Lyndsey Demos, PA-C 11/08/18 0501    Palumbo, April, MD 11/08/18 [phone]

## 2019-01-09 ENCOUNTER — Ambulatory Visit: Payer: Self-pay | Attending: Internal Medicine

## 2019-01-09 ENCOUNTER — Other Ambulatory Visit: Payer: Self-pay

## 2019-01-09 DIAGNOSIS — Z20822 Contact with and (suspected) exposure to covid-19: Secondary | ICD-10-CM

## 2019-01-10 LAB — NOVEL CORONAVIRUS, NAA: SARS-CoV-2, NAA: NOT DETECTED

## 2020-06-04 ENCOUNTER — Other Ambulatory Visit: Payer: Self-pay

## 2020-06-04 ENCOUNTER — Emergency Department (HOSPITAL_COMMUNITY)
Admission: EM | Admit: 2020-06-04 | Discharge: 2020-06-04 | Disposition: A | Discharge status: Home / Self Care | Payer: Self-pay | Attending: Emergency Medicine | Admitting: Emergency Medicine

## 2020-06-04 ENCOUNTER — Encounter (HOSPITAL_COMMUNITY): Payer: Self-pay | Admitting: Emergency Medicine

## 2020-06-04 DIAGNOSIS — Z87891 Personal history of nicotine dependence: Secondary | ICD-10-CM | POA: Insufficient documentation

## 2020-06-04 DIAGNOSIS — Z202 Contact with and (suspected) exposure to infections with a predominantly sexual mode of transmission: Secondary | ICD-10-CM | POA: Insufficient documentation

## 2020-06-04 MED ORDER — DOXYCYCLINE HYCLATE 100 MG PO CAPS: 100.0000 mg | ORAL_CAPSULE | Freq: Two times a day (BID) | ORAL | 0 refills | Status: AC

## 2020-06-04 MED ORDER — CEFTRIAXONE SODIUM 500 MG IJ SOLR
500.0000 mg | Freq: Once | INTRAMUSCULAR | Status: AC
  Administered 2020-06-04: 500 mg via INTRAMUSCULAR
  Filled 2020-06-04: qty 500

## 2020-06-04 NOTE — Discharge Instructions (Signed)
You have been seen today in the Emergency Department (ED) for possible STD exposure. You have been treated with a one time dose medication but will need to take one week of antibiotics.  Please have your partner tested for STD's and do not resume sexual activity until you and your partner have confirmed negative test results or have both been treated.  Please follow up with your doctor as soon as possible regarding today's ED visit and your symptoms.   Return to the ED if your pain worsens, you develop a fever, or for any other symptoms that concern you.

## 2020-06-04 NOTE — ED Provider Notes (Signed)
Emergency Department Provider Note   I have reviewed the triage vital signs and the nursing notes.   HISTORY  Chief Complaint 74.5 (Exposed yesterday to possible STD.  )   HPI Philip Bowman is a 30 y.o. male with past medical history reviewed below presents to the emergency department with mild discharge after unprotected sex yesterday.  Patient states that he was with someone yesterday that he has not been with before.  He is unsure of their STD status.  He states this morning he had some mild discomfort which he attributes to just having sex yesterday but has had STDs in the past.  He states he has another partner and does not want to pass anything to them is requesting testing.  No abdominal pain or fever.  No allergies to antibiotics.  History reviewed. No pertinent past medical history.  There are no problems to display for this patient.   Past Surgical History:  Procedure Laterality Date  . EYE SURGERY Left     Allergies Patient has no known allergies.  History reviewed. No pertinent family history.  Social History Social History   Tobacco Use  . Smoking status: Former Smoker    Packs/day: 0.50    Types: Cigars  . Smokeless tobacco: Never Used  Vaping Use  . Vaping Use: Every day  Substance Use Topics  . Alcohol use: Never  . Drug use: No    Review of Systems  Gastrointestinal: No abdominal pain.   Genitourinary: Negative for dysuria. Mild urethral discharge.  Musculoskeletal: Negative for back pain. Skin: Negative for rash.    ____________________________________________   PHYSICAL EXAM:  VITAL SIGNS: Pulse: 68 BP: 135/59 RR: 18 SpO2: 98% RA  Constitutional: Alert and oriented. Well appearing and in no acute distress. Eyes: Conjunctivae are normal.  Head: Atraumatic. Nose: No congestion/rhinnorhea. Mouth/Throat: Mucous membranes are moist.  Neck: No stridor.  Cardiovascular: Good peripheral circulation. Respiratory: Normal  respiratory effort. Gastrointestinal: Soft and nontender. No distention.  Musculoskeletal: No gross deformities of extremities. Neurologic:  Normal speech and language.  Skin:  Skin is warm, dry and intact. No rash noted.   ____________________________________________   LABS (all labs ordered are listed, but only abnormal results are displayed)  Labs Reviewed  GC/CHLAMYDIA PROBE AMP ([place]) NOT AT [facility]   ____________________________________________  PROCEDURES  Procedure(s) performed:   Procedures  None  ____________________________________________   INITIAL IMPRESSION / ASSESSMENT AND PLAN / ED COURSE  Pertinent labs & imaging results that were available during my care of the patient were reviewed by me and considered in my medical decision making (see chart for details).   Patient presents to the emergency department with concern for possible STD exposure.  He had unprotected sex yesterday and has some mild discharge.  Plan for urine gonorrhea/chlamydia testing but with symptoms we will treat empirically with Rocephin and 1 week of doxycycline per CDC guidelines.  We discussed potential for HIV and syphilis exposure.  He does not wish to have testing done today.  We discussed that he should be tested for HIV in 2 weeks either through his PCP or the health department given his exposure yesterday.  Patient tells me he will think about it.  Discussed abstinence/condom use until completing treatment and to make partner(s) aware of symptoms and testing/treatment recommendation.    ____________________________________________  FINAL CLINICAL IMPRESSION(S) / ED DIAGNOSES  Final diagnoses:  STD exposure     MEDICATIONS GIVEN DURING THIS VISIT:  Medications  cefTRIAXone (ROCEPHIN) injection  500 mg (has no administration in time range)     NEW OUTPATIENT MEDICATIONS STARTED DURING THIS VISIT:  New Prescriptions   DOXYCYCLINE (VIBRAMYCIN) 100 MG CAPSULE    Take 1  capsule (100 mg total) by mouth 2 (two) times daily for 7 days.    Note:  This document was prepared using Dragon voice recognition software and may include unintentional dictation errors.  Alona Bene, MD, [facility] Emergency Medicine    Izyan Ezzell, Arlyss Repress, MD 06/04/20 [phone]

## 2020-06-07 LAB — GC/CHLAMYDIA PROBE AMP (~~LOC~~) NOT AT ARMC
Chlamydia: NEGATIVE
Comment: NEGATIVE
Comment: NORMAL
Neisseria Gonorrhea: NEGATIVE

## 2020-09-06 ENCOUNTER — Encounter (HOSPITAL_COMMUNITY): Payer: Self-pay

## 2020-09-06 ENCOUNTER — Other Ambulatory Visit: Payer: Self-pay

## 2020-09-06 ENCOUNTER — Emergency Department (HOSPITAL_COMMUNITY)
Admission: EM | Admit: 2020-09-06 | Discharge: 2020-09-06 | Disposition: A | Discharge status: Home / Self Care | Payer: Self-pay | Attending: Student | Admitting: Student

## 2020-09-06 DIAGNOSIS — J069 Acute upper respiratory infection, unspecified: Secondary | ICD-10-CM | POA: Insufficient documentation

## 2020-09-06 DIAGNOSIS — Z20822 Contact with and (suspected) exposure to covid-19: Secondary | ICD-10-CM | POA: Insufficient documentation

## 2020-09-06 DIAGNOSIS — Z87891 Personal history of nicotine dependence: Secondary | ICD-10-CM | POA: Insufficient documentation

## 2020-09-06 LAB — RESP PANEL BY RT-PCR (FLU A&B, COVID) ARPGX2
Influenza A by PCR: NEGATIVE
Influenza B by PCR: NEGATIVE
SARS Coronavirus 2 by RT PCR: NEGATIVE

## 2020-09-06 NOTE — Discharge Instructions (Addendum)
Your covid test is pending 

## 2020-09-06 NOTE — ED Provider Notes (Signed)
[facility] EMERGENCY DEPARTMENT Provider Note   CSN: [id number] Arrival date & time: 09/06/20  1113     History Chief Complaint  Patient presents with   Covid Positive    Philip Bowman is a 30 y.o. male.  Pt reports he had a positive home covid test.  Pt asked for a confirmation test here  Pt reports congestion   The history is provided by the patient. No language interpreter was used.  Cough Cough characteristics:  Non-productive Sputum characteristics:  Nondescript Severity:  Moderate Onset quality:  Gradual Timing:  Constant Smoker: no   Relieved by:  Nothing Worsened by:  Nothing Ineffective treatments:  None tried     History reviewed. No pertinent past medical history.  There are no problems to display for this patient.   Past Surgical History:  Procedure Laterality Date   EYE SURGERY Left        History reviewed. No pertinent family history.  Social History   Tobacco Use   Smoking status: Former    Packs/day: 0.50    Types: Cigars, Cigarettes   Smokeless tobacco: Never  Vaping Use   Vaping Use: Every day  Substance Use Topics   Alcohol use: Never   Drug use: No    Home Medications Prior to Admission medications   Medication Sig Start Date End Date Taking? Authorizing Provider  acetaminophen (TYLENOL) 500 MG tablet Take 1,000 mg by mouth every 6 (six) hours as needed for mild pain.    [provider]  cephALEXin (KEFLEX) 500 MG capsule Take 1 capsule (500 mg total) by mouth 4 (four) times daily. 11/08/18   Antony Madura, PA-C  ibuprofen (ADVIL) 600 MG tablet Take 1 tablet (600 mg total) by mouth every 6 (six) hours as needed. Patient not taking: Reported on 11/08/2018 05/14/18   Elpidio Anis, PA-C    Allergies    Patient has no known allergies.  Review of Systems   Review of Systems  Respiratory:  Positive for cough.   All other systems reviewed and are negative.  Physical Exam Updated Vital Signs BP 112/69 (BP Location:  Right Arm)   Pulse 73   Temp 98.7 F (37.1 C) (Oral)   Resp 16   Ht 5\' 7"  (1.702 m)   Wt 65.8 kg   SpO2 100%   BMI 22.71 kg/m   Physical Exam Vitals and nursing note reviewed.  Constitutional:      Appearance: He is well-developed.  HENT:     Head: Normocephalic and atraumatic.  Eyes:     Conjunctiva/sclera: Conjunctivae normal.  Cardiovascular:     Rate and Rhythm: Normal rate and regular rhythm.     Heart sounds: No murmur heard. Pulmonary:     Effort: Pulmonary effort is normal. No respiratory distress.     Breath sounds: Normal breath sounds.  Musculoskeletal:     Cervical back: Neck supple.  Skin:    General: Skin is warm and dry.  Neurological:     Mental Status: He is alert.    ED Results / Procedures / Treatments   Labs (all labs ordered are listed, but only abnormal results are displayed) Labs Reviewed  RESP PANEL BY RT-PCR (FLU A&B, COVID) ARPGX2    EKG None  Radiology No results found.  Procedures Procedures   Medications Ordered in ED Medications - No data to display  ED Course  I have reviewed the triage vital signs and the nursing notes.  Pertinent labs & imaging results that  were available during my care of the patient were reviewed by me and considered in my medical decision making (see chart for details).    MDM Rules/Calculators/A&P                           MDM:  Covid is positive  Final Clinical Impression(s) / ED Diagnoses Final diagnoses:  Upper respiratory tract infection, unspecified type    Rx / DC Orders ED Discharge Orders     None     An After Visit Summary was printed and given to the patient.    Elson Areas, PA-C 09/06/20 1814    Glendora Score, MD 09/07/20 [phone]

## 2020-09-06 NOTE — ED Triage Notes (Signed)
Pt. States they went swimming last week and haven't felt good since. Pt. States they took a covid test yesterday and wants to make sure the test is accurate.

## 2021-04-21 ENCOUNTER — Encounter (HOSPITAL_COMMUNITY): Payer: Self-pay

## 2021-04-21 ENCOUNTER — Emergency Department (HOSPITAL_COMMUNITY)
Admission: EM | Admit: 2021-04-21 | Discharge: 2021-04-21 | Disposition: A | Discharge status: Home / Self Care | Payer: Self-pay | Attending: Emergency Medicine | Admitting: Emergency Medicine

## 2021-04-21 ENCOUNTER — Other Ambulatory Visit: Payer: Self-pay

## 2021-04-21 DIAGNOSIS — Z87891 Personal history of nicotine dependence: Secondary | ICD-10-CM | POA: Insufficient documentation

## 2021-04-21 DIAGNOSIS — K1379 Other lesions of oral mucosa: Secondary | ICD-10-CM | POA: Insufficient documentation

## 2021-04-21 MED ORDER — LIDOCAINE VISCOUS HCL 2 % MT SOLN
15.0000 mL | Freq: Once | OROMUCOSAL | Status: AC
  Administered 2021-04-21: 15 mL via OROMUCOSAL
  Filled 2021-04-21: qty 15

## 2021-04-21 MED ORDER — LIDOCAINE VISCOUS HCL 2 % MT SOLN: 15.0000 mL | OROMUCOSAL | 0 refills | Status: DC | PRN

## 2021-04-21 MED ORDER — LIDOCAINE VISCOUS HCL 2 % MT SOLN: 15.0000 mL | OROMUCOSAL | 0 refills | Status: AC | PRN

## 2021-04-21 NOTE — Discharge Instructions (Signed)
Please pick up prescriptions from the pharmacy and take as prescribed.  Return to the emergency department for any worsening symptoms. ?
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
## 2021-04-21 NOTE — ED Triage Notes (Signed)
Patient with complaints of pain to the roof of his mouth for 3 days.  ?

## 2022-03-09 ENCOUNTER — Encounter (HOSPITAL_COMMUNITY): Payer: Self-pay | Admitting: Emergency Medicine

## 2022-03-09 ENCOUNTER — Emergency Department (HOSPITAL_COMMUNITY)
Admission: EM | Admit: 2022-03-09 | Discharge: 2022-03-10 | Disposition: A | Discharge status: Home / Self Care | Payer: Self-pay | Attending: Emergency Medicine | Admitting: Emergency Medicine

## 2022-03-09 ENCOUNTER — Other Ambulatory Visit: Payer: Self-pay

## 2022-03-09 DIAGNOSIS — K649 Unspecified hemorrhoids: Secondary | ICD-10-CM | POA: Insufficient documentation

## 2022-03-09 DIAGNOSIS — Z87891 Personal history of nicotine dependence: Secondary | ICD-10-CM | POA: Insufficient documentation

## 2022-03-09 NOTE — ED Triage Notes (Signed)
Pt c/o of a hemorrhoid that got worse yesterday. Pt has tried putting hemorrhoid cream on it. Pt states hemorrhoid has gotten bigger and more painful. Pt denies any blood with bowel movements.

## 2022-03-10 MED ORDER — HEMORRHOIDAL 0.25-14-74.9 % RE OINT: 1.0000 | TOPICAL_OINTMENT | Freq: Two times a day (BID) | RECTAL | 1 refills | Status: AC | PRN

## 2022-03-10 NOTE — ED Provider Notes (Signed)
[facility] Emergency Department Provider Note MRN:  [id number]  Arrival date & time: 03/10/22     Chief Complaint   Hemorrhoids   History of Present Illness   Philip Bowman is a 32 y.o. year-old male with no pertinent past medical history presenting to the ED with chief complaint of hemorrhoid.  Something sticking out of his butt.  Thinks it is a hemorrhoid.  Mildly uncomfortable on occasion.  Has been straining with bowel movements.  Review of Systems  A thorough review of systems was obtained and all systems are negative except as noted in the HPI and PMH.   Patient's Health History   History reviewed. No pertinent past medical history.  Past Surgical History:  Procedure Laterality Date   EYE SURGERY Left     History reviewed. No pertinent family history.  Social History   Socioeconomic History   Marital status: Married    Spouse name: Not on file   Number of children: Not on file   Years of education: Not on file   Highest education level: Not on file  Occupational History   Not on file  Tobacco Use   Smoking status: Former    Packs/day: 0.50    Types: Cigars, Cigarettes   Smokeless tobacco: Never  Vaping Use   Vaping Use: Every day  Substance and Sexual Activity   Alcohol use: Never   Drug use: No   Sexual activity: Not on file  Other Topics Concern   Not on file  Social History Narrative   Not on file   Social Determinants of Health   Financial Resource Strain: Not on file  Food Insecurity: Not on file  Transportation Needs: Not on file  Physical Activity: Not on file  Stress: Not on file  Social Connections: Not on file  Intimate Partner Violence: Not on file     Physical Exam   Vitals:   03/09/22 2212  BP: (!) 112/56  Pulse: (!) 59  Resp: 16  Temp: 98 F (36.7 C)  SpO2: 99%    CONSTITUTIONAL: Well-appearing, NAD NEURO/PSYCH:  Alert and oriented x 3, no focal deficits EYES:  eyes equal and reactive ENT/NECK:   no LAD, no JVD CARDIO: Regular rate, well-perfused, normal S1 and S2 PULM:  CTAB no wheezing or rhonchi GI/GU:  non-distended, non-tender MSK/SPINE:  No gross deformities, no edema SKIN:  no rash, atraumatic   *Additional and/or pertinent findings included in MDM below  Diagnostic and Interventional Summary    EKG Interpretation  Date/Time:    Ventricular Rate:    PR Interval:    QRS Duration:   QT Interval:    QTC Calculation:   R Axis:     Text Interpretation:         Labs Reviewed - No data to display  No orders to display    Medications - No data to display   Procedures  /  Critical Care Procedures  ED Course and Medical Decision Making  Initial Impression and Ddx Hemorrhoid on exam, no signs of perianal abscess or infection, the hemorrhoid is not thrombosed, is not particularly painful or tender.  No emergent process.  Past medical/surgical history that increases complexity of ED encounter: None  Interpretation of Diagnostics Laboratory and/or imaging options to aid in the diagnosis/care of the patient were considered.  After careful history and physical examination, it was determined that there was no indication for diagnostics at this time.  Patient Reassessment and Ultimate Disposition/Management  Discharge  Patient management required discussion with the following services or consulting groups:  None  Complexity of Problems Addressed Acute complicated illness or Injury  Additional Data Reviewed and Analyzed Further history obtained from: None  Additional Factors Impacting ED Encounter Risk Prescriptions  Barth Kirks. [place] Small, [place] [email or web address]  Final Clinical Impressions(s) / ED Diagnoses     ICD-10-CM   1. Hemorrhoids, unspecified hemorrhoid type  K64.9       ED Discharge Orders          Ordered    phenylephrine-shark liver oil-mineral oil-petrolatum (HEMORRHOIDAL)  0.25-14-74.9 % rectal ointment  2 times daily PRN        03/10/22 0114             Discharge Instructions Discussed with and Provided to Patient:    Discharge Instructions      You were evaluated in the Emergency Department and after careful evaluation, we did not find any emergent condition requiring admission or further testing in the hospital.  Your exam/testing today is overall reassuring.  Recommend over-the-counter Metamucil daily to increase the fiber in your diet.  Also recommend over-the-counter MiraLAX to soften your stools.  This will help with hemorrhoids.  Can use the hemorrhoid cream as well as needed.  You can call the general surgeons if your hemorrhoids continue to bother you despite these interventions over the next few weeks.  Please return to the Emergency Department if you experience any worsening of your condition.   Thank you for allowing [place] to be a part of your care.      Maudie Flakes, MD 03/10/22 [phone]

## 2022-03-10 NOTE — Discharge Instructions (Addendum)
You were evaluated in the Emergency Department and after careful evaluation, we did not find any emergent condition requiring admission or further testing in the hospital.  Your exam/testing today is overall reassuring.  Recommend over-the-counter Metamucil daily to increase the fiber in your diet.  Also recommend over-the-counter MiraLAX to soften your stools.  This will help with hemorrhoids.  Can use the hemorrhoid cream as well as needed.  You can call the general surgeons if your hemorrhoids continue to bother you despite these interventions over the next few weeks.  Please return to the Emergency Department if you experience any worsening of your condition.   Thank you for allowing [place] to be a part of your care.
